# Patient Record
Sex: Male | Born: 1955 | Race: White | Hispanic: No | Marital: Married | Smoking: Former smoker
Health system: Southern US, Community
[De-identification: ages and names within clinical notes are randomized; demographics above are authoritative.]

## PROBLEM LIST (undated history)

## (undated) DIAGNOSIS — F329 Major depressive disorder, single episode, unspecified: Secondary | ICD-10-CM

## (undated) DIAGNOSIS — F32A Depression, unspecified: Secondary | ICD-10-CM

## (undated) DIAGNOSIS — K759 Inflammatory liver disease, unspecified: Secondary | ICD-10-CM

## (undated) DIAGNOSIS — H9190 Unspecified hearing loss, unspecified ear: Secondary | ICD-10-CM

## (undated) DIAGNOSIS — M1990 Unspecified osteoarthritis, unspecified site: Secondary | ICD-10-CM

## (undated) HISTORY — PX: JOINT REPLACEMENT: SHX530

## (undated) HISTORY — PX: OTHER SURGICAL HISTORY: SHX169

## (undated) HISTORY — PX: CATARACT EXTRACTION W/ INTRAOCULAR LENS IMPLANT: SHX1309

## (undated) HISTORY — PX: EYE SURGERY: SHX253

---

## 2004-02-07 HISTORY — PX: AMPUTATION FINGER: SHX6594

## 2004-07-24 ENCOUNTER — Emergency Department (HOSPITAL_COMMUNITY): Admission: EM | Admit: 2004-07-24 | Discharge: 2004-07-24 | Payer: Self-pay | Admitting: Emergency Medicine

## 2005-08-08 ENCOUNTER — Encounter: Admission: RE | Admit: 2005-08-08 | Discharge: 2005-08-08 | Payer: Self-pay | Admitting: Family Medicine

## 2008-06-15 ENCOUNTER — Ambulatory Visit: Payer: Self-pay | Admitting: Diagnostic Radiology

## 2008-06-15 ENCOUNTER — Emergency Department (HOSPITAL_BASED_OUTPATIENT_CLINIC_OR_DEPARTMENT_OTHER): Admission: EM | Admit: 2008-06-15 | Discharge: 2008-06-15 | Payer: Self-pay | Admitting: Emergency Medicine

## 2016-02-09 DIAGNOSIS — M9904 Segmental and somatic dysfunction of sacral region: Secondary | ICD-10-CM | POA: Diagnosis not present

## 2016-02-09 DIAGNOSIS — M25552 Pain in left hip: Secondary | ICD-10-CM | POA: Diagnosis not present

## 2016-02-09 DIAGNOSIS — M9903 Segmental and somatic dysfunction of lumbar region: Secondary | ICD-10-CM | POA: Diagnosis not present

## 2016-02-11 DIAGNOSIS — M9904 Segmental and somatic dysfunction of sacral region: Secondary | ICD-10-CM | POA: Diagnosis not present

## 2016-02-11 DIAGNOSIS — M25552 Pain in left hip: Secondary | ICD-10-CM | POA: Diagnosis not present

## 2016-02-11 DIAGNOSIS — M9903 Segmental and somatic dysfunction of lumbar region: Secondary | ICD-10-CM | POA: Diagnosis not present

## 2016-02-17 DIAGNOSIS — M9904 Segmental and somatic dysfunction of sacral region: Secondary | ICD-10-CM | POA: Diagnosis not present

## 2016-02-17 DIAGNOSIS — M9903 Segmental and somatic dysfunction of lumbar region: Secondary | ICD-10-CM | POA: Diagnosis not present

## 2016-02-17 DIAGNOSIS — M25552 Pain in left hip: Secondary | ICD-10-CM | POA: Diagnosis not present

## 2016-02-22 DIAGNOSIS — M9903 Segmental and somatic dysfunction of lumbar region: Secondary | ICD-10-CM | POA: Diagnosis not present

## 2016-02-22 DIAGNOSIS — M9904 Segmental and somatic dysfunction of sacral region: Secondary | ICD-10-CM | POA: Diagnosis not present

## 2016-02-22 DIAGNOSIS — M25552 Pain in left hip: Secondary | ICD-10-CM | POA: Diagnosis not present

## 2016-03-06 ENCOUNTER — Encounter (INDEPENDENT_AMBULATORY_CARE_PROVIDER_SITE_OTHER): Payer: Self-pay | Admitting: Orthopaedic Surgery

## 2016-03-06 ENCOUNTER — Ambulatory Visit (INDEPENDENT_AMBULATORY_CARE_PROVIDER_SITE_OTHER): Payer: 59 | Admitting: Orthopaedic Surgery

## 2016-03-06 ENCOUNTER — Ambulatory Visit (INDEPENDENT_AMBULATORY_CARE_PROVIDER_SITE_OTHER): Payer: Self-pay

## 2016-03-06 ENCOUNTER — Ambulatory Visit (INDEPENDENT_AMBULATORY_CARE_PROVIDER_SITE_OTHER): Payer: 59

## 2016-03-06 DIAGNOSIS — M25552 Pain in left hip: Secondary | ICD-10-CM | POA: Diagnosis not present

## 2016-03-06 DIAGNOSIS — M1612 Unilateral primary osteoarthritis, left hip: Secondary | ICD-10-CM | POA: Diagnosis not present

## 2016-03-06 MED ORDER — LIDOCAINE HCL 2 % IJ SOLN
4.0000 mL | INTRAMUSCULAR | Status: AC | PRN
  Administered 2016-03-06: 4 mL

## 2016-03-06 MED ORDER — TRIAMCINOLONE ACETONIDE 40 MG/ML IJ SUSP
80.0000 mg | INTRAMUSCULAR | Status: AC | PRN
  Administered 2016-03-06: 80 mg via INTRA_ARTICULAR

## 2016-03-06 NOTE — Progress Notes (Signed)
   Office Visit Note   Patient: Rick Flores           Date of Birth: [birth year]           MRN: [id number] Visit Date: 03/06/2016              Requested by: No referring provider defined for this encounter. PCP: No primary care provider on file.   Assessment & Plan: Visit Diagnoses:  1. Primary osteoarthritis of left hip     Plan: She has advanced discharge joint disease of left hip. Injection with Dr. Alvester MorinNewton was performed today. I'll like to see him back in about 6 weeks or so to see how he is doing. We did discuss briefly about hip replacement surgery. He does have a pamphlet.  Follow-Up Instructions: No Follow-up on file.   Orders:  Orders Placed This Encounter  Procedures  . Large Joint Injection/Arthrocentesis  . XR HIP UNILAT W OR W/O PELVIS 2-3 VIEWS LEFT  . XR C-ARM NO REPORT   No orders of the defined types were placed in this encounter.     Procedures: No procedures performed   Clinical Data: No additional findings.   Subjective: Chief Complaint  Patient presents with  . Left Hip - Pain    Patient is 61 year old gentleman very active with time months history of left hip pain worsening over the last 3 months. He is very active pain is 7-8 out of 10. Pain is localized in his left hip. This is worse with activity and running. Pain does not radiate. Denies back pain. This is affecting his activity level. He is taking ibuprofen occasionally.    Review of Systems Complete review of systems negative except for history of present illness  Objective: Vital Signs: There were no vitals taken for this visit.  Physical Exam  Constitutional: He is oriented to person, place, and time. He appears well-developed and well-nourished.  HENT:  Head: Normocephalic and atraumatic.  Eyes: Pupils are equal, round, and reactive to light.  Neck: Neck supple.  Pulmonary/Chest: Effort normal.  Abdominal: Soft.  Musculoskeletal: Normal range of motion.  Neurological: He is  alert and oriented to person, place, and time.  Skin: Skin is warm.  Psychiatric: He has a normal mood and affect. His behavior is normal. Judgment and thought content normal.  Nursing note and vitals reviewed.   Ortho Exam Exam the left hip shows negative Stinchfield sign. Mild discomfort with internal rotation of the hip. External rotation is normal. Lateral hip is nontender. Specialty Comments:  No specialty comments available.  Imaging: Xr Hip Unilat W Or W/o Pelvis 2-3 Views Left  Result Date: 03/06/2016 Advanced degenerative joint disease of left hip with moderate discharge joint disease of right hip.    PMFS History: There are no active problems to display for this patient.  No past medical history on file.  No family history on file.  No past surgical history on file. Social History   Occupational History  . Not on file.   Social History Main Topics  . Smoking status: Never Smoker  . Smokeless tobacco: Never Used  . Alcohol use Not on file  . Drug use: Unknown  . Sexual activity: Not on file

## 2016-03-06 NOTE — Progress Notes (Signed)
   Rick Flores - 61 y.o. male MRN [id number]  Date of birth: Jun 02, 1955  Office Visit Note: Visit Date: 03/06/2016 PCP: No primary care provider on file. Referred by: No ref. provider found  Subjective: Chief Complaint  Patient presents with  . Left Hip - Pain   HPI: Rick Flores is a 61 year old gentleman who saw Dr. Roda ShuttersXu today for hip pain and left-sided. He has osteoporosis of the left hip. We did complete an anesthetic arthrogram today diagnostically and therapeutically.    ROS Otherwise per HPI.  Assessment & Plan: Visit Diagnoses:  1. Pain in left hip     Plan: Findings:  Anesthetic left hip arthrogram with fluoroscopic guidance.    Meds & Orders: No orders of the defined types were placed in this encounter.   Orders Placed This Encounter  Procedures  . XR HIP UNILAT W OR W/O PELVIS 2-3 VIEWS LEFT    Follow-up: No Follow-up on file.   Procedures: Left hip anesthetic arthrogram Date/Time: 03/06/2016 11:26 AM Performed by: Tyrell AntonioNEWTON, Damein Gaunce Authorized by: Tyrell AntonioNEWTON, Micaiah Litle   Consent Given by:  Patient Site marked: the procedure site was marked   Timeout: prior to procedure the correct patient, procedure, and site was verified   Indications:  Pain and diagnostic evaluation Location:  Hip Site:  L hip joint Prep: patient was prepped and draped in usual sterile fashion   Needle Size:  22 G Approach:  Anterior Ultrasound Guidance: No   Fluoroscopic Guidance: No   Arthrogram: Yes   Medications:  4 mL lidocaine 2 %; 80 mg triamcinolone acetonide 40 MG/ML Aspiration Attempted: Yes   Patient tolerance:  Patient tolerated the procedure well with no immediate complications  Arthrogram demonstrated excellent flow of contrast throughout the joint surface without extravasation or obvious defect.  The patient had relief of symptoms during the anesthetic phase of the injection.     No notes on file   Clinical History: No specialty comments available.  He reports that he has  never smoked. He has never used smokeless tobacco. No results for input(s): HGBA1C, LABURIC in the last 8760 hours.  Objective:  VS:  HT:    WT:   BMI:     BP:   HR: bpm  TEMP: ( )  RESP:  Physical Exam  Musculoskeletal:  Pain with internal rotation of the left hip with limited range of motion.    Ortho Exam Imaging: No results found.  Past Medical/Family/Surgical/Social History: Medications & Allergies reviewed per EMR There are no active problems to display for this patient.  No past medical history on file. No family history on file. No past surgical history on file. Social History   Occupational History  . Not on file.   Social History Main Topics  . Smoking status: Never Smoker  . Smokeless tobacco: Never Used  . Alcohol use Not on file  . Drug use: Unknown  . Sexual activity: Not on file

## 2016-04-17 ENCOUNTER — Ambulatory Visit (INDEPENDENT_AMBULATORY_CARE_PROVIDER_SITE_OTHER): Payer: 59 | Admitting: Orthopaedic Surgery

## 2016-04-18 ENCOUNTER — Encounter (INDEPENDENT_AMBULATORY_CARE_PROVIDER_SITE_OTHER): Payer: Self-pay | Admitting: Orthopaedic Surgery

## 2016-04-18 ENCOUNTER — Ambulatory Visit (INDEPENDENT_AMBULATORY_CARE_PROVIDER_SITE_OTHER): Payer: 59 | Admitting: Orthopaedic Surgery

## 2016-04-18 DIAGNOSIS — M1612 Unilateral primary osteoarthritis, left hip: Secondary | ICD-10-CM | POA: Diagnosis not present

## 2016-04-18 NOTE — Progress Notes (Signed)
   Office Visit Note   Patient: Rick Flores           Date of Birth: [birth year]           MRN: [id number] Visit Date: 04/18/2016              Requested by: No referring provider defined for this encounter. PCP: Emeterio ReeveWOLTERS,SHARON A, MD   Assessment & Plan: Visit Diagnoses:  1. Primary osteoarthritis of left hip     Plan: at this point, patient is doing well.  Discussed injections 3x a year if needed.  He has my card if he needs me.  F/u prn.  Follow-Up Instructions: Return if symptoms worsen or fail to improve.   Orders:  No orders of the defined types were placed in this encounter.  No orders of the defined types were placed in this encounter.     Procedures: No procedures performed   Clinical Data: No additional findings.   Subjective: Chief Complaint  Patient presents with  . Left Hip - Pain    Patient f/u today for left hip OA s/p injection 6 weeks ago.  Doing 95% better.  Overall doing well.      Review of Systems   Objective: Vital Signs: There were no vitals taken for this visit.  Physical Exam  Ortho Exam Exam stable Specialty Comments:  No specialty comments available.  Imaging: No results found.   PMFS History: Patient Active Problem List   Diagnosis Date Noted  . Primary osteoarthritis of left hip 04/18/2016   No past medical history on file.  No family history on file.  No past surgical history on file. Social History   Occupational History  . Not on file.   Social History Main Topics  . Smoking status: Never Smoker  . Smokeless tobacco: Never Used  . Alcohol use Not on file  . Drug use: Unknown  . Sexual activity: Not on file

## 2016-06-13 DIAGNOSIS — M25552 Pain in left hip: Secondary | ICD-10-CM | POA: Diagnosis not present

## 2016-06-13 DIAGNOSIS — M1612 Unilateral primary osteoarthritis, left hip: Secondary | ICD-10-CM | POA: Diagnosis not present

## 2016-06-22 DIAGNOSIS — M25552 Pain in left hip: Secondary | ICD-10-CM | POA: Diagnosis not present

## 2016-06-22 DIAGNOSIS — M1612 Unilateral primary osteoarthritis, left hip: Secondary | ICD-10-CM | POA: Diagnosis not present

## 2016-06-23 ENCOUNTER — Other Ambulatory Visit (HOSPITAL_COMMUNITY): Payer: Self-pay | Admitting: Emergency Medicine

## 2016-06-23 NOTE — Patient Instructions (Signed)
Rick Flores  06/23/2016   Your procedure is scheduled on: 07-04-16  [facility]  Entrance Report to admitting at 735AM  Call this number if you have problems the morning of surgery [phone]   Remember: ONLY 1 PERSON MAY GO WITH YOU TO SHORT STAY TO GET  READY MORNING OF YOUR SURGERY.  Do not eat food or drink liquids :After Midnight.     Take these medicines the morning of surgery with A SIP OF WATER: aripiprazole(abilify), sertraline(zoloft)                                You may not have any metal on your body including hair pins and              piercings  Do not wear jewelry, make-up, lotions, powders or perfumes, deodorant              Men may shave face and neck.   Do not bring valuables to the hospital. [facility] IS NOT             RESPONSIBLE   FOR VALUABLES.  Contacts, dentures or bridgework may not be worn into surgery.  Leave suitcase in the car. After surgery it may be brought to your room.              Please read over the following fact sheets you were given: _____________________________________________________________________             [facility] - Preparing for [facility] Before surgery, you can play an important role.  Because skin is not sterile, your skin needs to be as free of germs as possible.  You can reduce the number of germs on your skin by washing with CHG (chlorahexidine gluconate) soap before surgery.  CHG is an antiseptic cleaner which kills germs and bonds with the skin to continue killing germs even after washing. Please DO NOT use if you have an allergy to CHG or antibacterial soaps.  If your skin becomes reddened/irritated stop using the CHG and inform your nurse when you arrive at Short Stay. Do not shave (including legs and underarms) for at least 48 hours prior to the first CHG shower.  You may shave your face/neck. Please follow these instructions carefully:  1.  Shower with CHG Soap the night  before surgery and the  morning of Surgery.  2.  If you choose to wash your hair, wash your hair first as usual with your  normal  shampoo.  3.  After you shampoo, rinse your hair and body thoroughly to remove the  shampoo.                           4.  Use CHG as you would any other liquid soap.  You can apply chg directly  to the skin and wash                       Gently with a scrungie or clean washcloth.  5.  Apply the CHG Soap to your body ONLY FROM THE NECK DOWN.   Do not use on face/ open  Wound or open sores. Avoid contact with eyes, ears mouth and genitals (private parts).                       Wash face,  Genitals (private parts) with your normal soap.             6.  Wash thoroughly, paying special attention to the area where your surgery  will be performed.  7.  Thoroughly rinse your body with warm water from the neck down.  8.  DO NOT shower/wash with your normal soap after using and rinsing off  the CHG Soap.                9.  Pat yourself dry with a clean towel.            10.  Wear clean pajamas.            11.  Place clean sheets on your bed the night of your first shower and do not  sleep with pets. Day of Surgery : Do not apply any lotions/deodorants the morning of surgery.  Please wear clean clothes to the hospital/surgery center.  FAILURE TO FOLLOW THESE INSTRUCTIONS MAY RESULT IN THE CANCELLATION OF YOUR SURGERY PATIENT SIGNATURE_________________________________  NURSE SIGNATURE__________________________________  ________________________________________________________________________   Rick Flores  An incentive spirometer is a tool that can help keep your lungs clear and active. This tool measures how well you are filling your lungs with each breath. Taking long deep breaths may help reverse or decrease the chance of developing breathing (pulmonary) problems (especially infection) following:  A long period of time when you are  unable to move or be active. BEFORE THE PROCEDURE   If the spirometer includes an indicator to show your [place] effort, your nurse or respiratory therapist will set it to a desired goal.  If possible, sit up straight or lean slightly forward. Try not to slouch.  Hold the incentive spirometer in an upright position. INSTRUCTIONS FOR USE  1. Sit on the edge of your bed if possible, or sit up as far as you can in bed or on a chair. 2. Hold the incentive spirometer in an upright position. 3. Breathe out normally. 4. Place the mouthpiece in your mouth and seal your lips tightly around it. 5. Breathe in slowly and as deeply as possible, raising the piston or the ball toward the top of the column. 6. Hold your breath for 3-5 seconds or for as long as possible. Allow the piston or ball to fall to the bottom of the column. 7. Remove the mouthpiece from your mouth and breathe out normally. 8. Rest for a few seconds and repeat Steps 1 through 7 at least 10 times every 1-2 hours when you are awake. Take your time and take a few normal breaths between deep breaths. 9. The spirometer may include an indicator to show your [place] effort. Use the indicator as a goal to work toward during each repetition. 10. After each set of 10 deep breaths, practice coughing to be sure your lungs are clear. If you have an incision (the cut made at the time of surgery), support your incision when coughing by placing a pillow or rolled up towels firmly against it. Once you are able to get out of bed, walk around indoors and cough well. You may stop using the incentive spirometer when instructed by your caregiver.  RISKS AND COMPLICATIONS  Take your time so you do not get  dizzy or light-headed.  If you are in pain, you may need to take or ask for pain medication before doing incentive spirometry. It is harder to take a deep breath if you are having pain. AFTER USE  Rest and breathe slowly and easily.  It can be helpful to  keep track of a log of your progress. Your caregiver can provide you with a simple table to help with this. If you are using the spirometer at home, follow these instructions: [place] IF:   You are having difficultly using the spirometer.  You have trouble using the spirometer as often as instructed.  Your pain medication is not giving enough relief while using the spirometer.  You develop fever of 100.5 F (38.1 C) or higher. SEEK IMMEDIATE MEDICAL CARE IF:   You cough up bloody sputum that had not been present before.  You develop fever of 102 F (38.9 C) or greater.  You develop worsening pain at or near the incision site. MAKE SURE YOU:   Understand these instructions.  Will watch your condition.  Will get help right away if you are not doing well or get worse. Document Released: 06/05/2006 Document Revised: 04/17/2011 Document Reviewed: 08/06/2006 ExitCare Patient Information 2014 ExitCare, [place].   ________________________________________________________________________  WHAT IS A BLOOD TRANSFUSION? Blood Transfusion Information  A transfusion is the replacement of blood or some of its parts. Blood is made up of multiple cells which provide different functions.  Red blood cells carry oxygen and are used for blood loss replacement.  White blood cells fight against infection.  Platelets control bleeding.  Plasma helps clot blood.  Other blood products are available for specialized needs, such as hemophilia or other clotting disorders. BEFORE THE TRANSFUSION  Who gives blood for transfusions?   Healthy volunteers who are fully evaluated to make sure their blood is safe. This is blood bank blood. Transfusion therapy is the safest it has ever been in the practice of medicine. Before blood is taken from a donor, a complete history is taken to make sure that person has no history of diseases nor engages in risky social behavior (examples are intravenous drug  use or sexual activity with multiple partners). The donor's travel history is screened to minimize risk of transmitting infections, such as malaria. The donated blood is tested for signs of infectious diseases, such as HIV and hepatitis. The blood is then tested to be sure it is compatible with you in order to minimize the chance of a transfusion reaction. If you or a relative donates blood, this is often done in anticipation of surgery and is not appropriate for emergency situations. It takes many days to process the donated blood. RISKS AND COMPLICATIONS Although transfusion therapy is very safe and saves many lives, the main dangers of transfusion include:   Getting an infectious disease.  Developing a transfusion reaction. This is an allergic reaction to something in the blood you were given. Every precaution is taken to prevent this. The decision to have a blood transfusion has been considered carefully by your caregiver before blood is given. Blood is not given unless the benefits outweigh the risks. AFTER THE TRANSFUSION  Right after receiving a blood transfusion, you will usually feel much better and more energetic. This is especially true if your red blood cells have gotten low (anemic). The transfusion raises the level of the red blood cells which carry oxygen, and this usually causes an energy increase.  The nurse administering the transfusion will  monitor you carefully for complications. HOME CARE INSTRUCTIONS  No special instructions are needed after a transfusion. You may find your energy is better. Speak with your caregiver about any limitations on activity for underlying diseases you may have. SEEK MEDICAL CARE IF:   Your condition is not improving after your transfusion.  You develop redness or irritation at the intravenous (IV) site. SEEK IMMEDIATE MEDICAL CARE IF:  Any of the following symptoms occur over the next 12 hours:  Shaking chills.  You have a temperature by mouth  above 102 F (38.9 C), not controlled by medicine.  Chest, back, or muscle pain.  People around you feel you are not acting correctly or are confused.  Shortness of breath or difficulty breathing.  Dizziness and fainting.  You get a rash or develop hives.  You have a decrease in urine output.  Your urine turns a dark color or changes to pink, red, or brown. Any of the following symptoms occur over the next 10 days:  You have a temperature by mouth above 102 F (38.9 C), not controlled by medicine.  Shortness of breath.  Weakness after normal activity.  The white part of the eye turns yellow (jaundice).  You have a decrease in the amount of urine or are urinating less often.  Your urine turns a dark color or changes to pink, red, or brown. Document Released: 01/21/2000 Document Revised: 04/17/2011 Document Reviewed: 09/09/2007 [facility] Patient Information 2014 [place], [place].  _______________________________________________________________________

## 2016-06-25 NOTE — H&P (Signed)
TOTAL HIP ADMISSION H&P  Patient is admitted for left total hip arthroplasty, anterior approach.  Subjective:  Chief Complaint:    Left hip primary OA . pain  HPI: Rick Flores, 61 y.o. male, has a history of pain and functional disability in the left hip(s) due to arthritis and patient has failed non-surgical conservative treatments for greater than 12 weeks to include NSAID's and/or analgesics, corticosteriod injections and activity modification.  Onset of symptoms was gradual starting ~15 years ago with gradually worsening course since that time.The patient noted no past surgery on the left hip(s).  Patient currently rates pain in the left hip at 7 out of 10 with activity. Patient has worsening of pain with activity and weight bearing, trendelenberg gait, pain that interfers with activities of daily living and pain with passive range of motion. Patient has evidence of periarticular osteophytes and joint space narrowing by imaging studies. This condition presents safety issues increasing the risk of falls.   There is no current active infection.   Risks, benefits and expectations were discussed with the patient.  Risks including but not limited to the risk of anesthesia, blood clots, nerve damage, blood vessel damage, failure of the prosthesis, infection and up to and including death.  Patient understand the risks, benefits and expectations and wishes to proceed with surgery.   PCP: Rick Flores, Sharon, Rick Flores  D/C Plans:       Home   Post-op Meds:       No Rx given  Tranexamic Acid:      To be given - IV   Decadron:      Is to be given  FYI:     ASA  [place]  DME:   Pt already has equipment  PT:   No PT    Patient Active Problem List   Diagnosis Date Noted  . Primary osteoarthritis of left hip 04/18/2016   Past Medical History:  Diagnosis Date  . Arthritis     Past Surgical History:  Procedure Laterality Date  . 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 colonscopy  7 years  ago    No prescriptions prior to admission.   No Known Allergies   Social History  Substance Use Topics  . Smoking status: Former Smoker    Years: 5.00    Types: Cigarettes    Quit date: [id number]  . Smokeless tobacco: Never Used  . Alcohol use 6.0 - 8.4 oz/week    5 - 7 Glasses of wine, 5 - 7 Shots of liquor per week       Review of Systems  Constitutional: Negative.   HENT: Negative.   Eyes: Negative.   Respiratory: Negative.   Cardiovascular: Negative.   Gastrointestinal: Negative.   Genitourinary: Negative.   Musculoskeletal: Positive for joint pain.  Skin: Negative.   Neurological: Negative.   Endo/Heme/Allergies: Negative.   Psychiatric/Behavioral: Negative.     Objective:  Physical Exam  Constitutional: He is oriented to person, place, and time. He appears well-developed.  HENT:  Head: Normocephalic.  Eyes: Pupils are equal, round, and reactive to light.  Neck: Neck supple. No JVD present. No tracheal deviation present. No thyromegaly present.  Cardiovascular: Normal rate, regular rhythm and intact distal pulses.   Respiratory: Effort normal and breath sounds normal. No respiratory distress. He has no wheezes.  GI: Soft. There is no tenderness. There is no guarding.  Musculoskeletal:       Left hip: He exhibits decreased range  of motion, decreased strength, tenderness and bony tenderness. He exhibits no swelling, no deformity and no laceration.  Lymphadenopathy:    He has no cervical adenopathy.  Neurological: He is alert and oriented to person, place, and time.  Skin: Skin is warm and dry.  Psychiatric: He has a normal mood and affect.      Imaging Review Plain radiographs demonstrate severe degenerative joint disease of the left hip(s). The bone quality appears to be good for age and reported activity level.  Assessment/Plan:  End stage arthritis, left hip(s)  The patient history, physical examination, clinical judgement of the provider and imaging  studies are consistent with end stage degenerative joint disease of the left hip(s) and total hip arthroplasty is deemed medically necessary. The treatment options including medical management, injection therapy, arthroscopy and arthroplasty were discussed at length. The risks and benefits of total hip arthroplasty were presented and reviewed. The risks due to aseptic loosening, infection, stiffness, dislocation/subluxation,  thromboembolic complications and other imponderables were discussed.  The patient acknowledged the explanation, agreed to proceed with the plan and consent was signed. Patient is being admitted for inpatient treatment for surgery, pain control, PT, OT, prophylactic antibiotics, VTE prophylaxis, progressive ambulation and ADL's and discharge planning.The patient is planning to be discharged home.     Rick Auerbach Jeselle Hiser   PA-C  06/27/2016, 2:51 PM

## 2016-06-27 ENCOUNTER — Encounter (HOSPITAL_COMMUNITY)
Admission: RE | Admit: 2016-06-27 | Discharge: 2016-06-27 | Disposition: A | Discharge status: Home / Self Care | Payer: 59 | Source: Ambulatory Visit | Attending: Orthopedic Surgery | Admitting: Orthopedic Surgery

## 2016-06-27 ENCOUNTER — Encounter (HOSPITAL_COMMUNITY): Payer: Self-pay

## 2016-06-27 DIAGNOSIS — Z01812 Encounter for preprocedural laboratory examination: Secondary | ICD-10-CM | POA: Insufficient documentation

## 2016-06-27 DIAGNOSIS — Z01818 Encounter for other preprocedural examination: Secondary | ICD-10-CM | POA: Diagnosis present

## 2016-06-27 DIAGNOSIS — M1612 Unilateral primary osteoarthritis, left hip: Secondary | ICD-10-CM | POA: Diagnosis not present

## 2016-06-27 HISTORY — DX: Unspecified osteoarthritis, unspecified site: M19.90

## 2016-06-27 LAB — BASIC METABOLIC PANEL
Anion gap: 9 (ref 5–15)
BUN: 18 mg/dL (ref 6–20)
CO2: 28 mmol/L (ref 22–32)
CREATININE: 0.87 mg/dL (ref 0.61–1.24)
Calcium: 9.2 mg/dL (ref 8.9–10.3)
Chloride: 100 mmol/L — ABNORMAL LOW (ref 101–111)
GFR calc Af Amer: >60 mL/min (ref >60)
Glucose, Bld: 96 mg/dL (ref 65–99)
Potassium: 4 mmol/L (ref 3.5–5.1)
SODIUM: 137 mmol/L (ref 135–145)

## 2016-06-27 LAB — ABO/RH: ABO/RH(D): O POS

## 2016-06-27 LAB — CBC
HCT: 42.9 % (ref 39.0–52.0)
Hemoglobin: 15.3 g/dL (ref 13.0–17.0)
MCH: 34.2 pg — ABNORMAL HIGH (ref 26.0–34.0)
MCHC: 35.7 g/dL (ref 30.0–36.0)
MCV: 96 fL (ref 78.0–100.0)
Platelets: 195 10*3/uL (ref 150–400)
RBC: 4.47 MIL/uL (ref 4.22–5.81)
RDW: 12 % (ref 11.5–15.5)
WBC: 5.6 10*3/uL (ref 4.0–10.5)

## 2016-06-28 LAB — SURGICAL PCR SCREEN
MRSA, PCR: POSITIVE — AB
STAPHYLOCOCCUS AUREUS: POSITIVE — AB

## 2016-06-29 DIAGNOSIS — Z01818 Encounter for other preprocedural examination: Secondary | ICD-10-CM | POA: Diagnosis not present

## 2016-06-29 DIAGNOSIS — M1990 Unspecified osteoarthritis, unspecified site: Secondary | ICD-10-CM | POA: Diagnosis not present

## 2016-06-29 DIAGNOSIS — E785 Hyperlipidemia, unspecified: Secondary | ICD-10-CM | POA: Diagnosis not present

## 2016-06-29 DIAGNOSIS — Z125 Encounter for screening for malignant neoplasm of prostate: Secondary | ICD-10-CM | POA: Diagnosis not present

## 2016-06-29 DIAGNOSIS — Z79899 Other long term (current) drug therapy: Secondary | ICD-10-CM | POA: Diagnosis not present

## 2016-07-04 ENCOUNTER — Encounter (HOSPITAL_COMMUNITY): Payer: Self-pay

## 2016-07-04 ENCOUNTER — Ambulatory Visit (HOSPITAL_COMMUNITY): Payer: 59 | Admitting: Anesthesiology

## 2016-07-04 ENCOUNTER — Encounter (HOSPITAL_COMMUNITY)
Admission: AD | Disposition: A | Discharge status: Home / Self Care | Payer: Self-pay | Source: Ambulatory Visit | Attending: Orthopedic Surgery

## 2016-07-04 ENCOUNTER — Inpatient Hospital Stay (HOSPITAL_COMMUNITY): Payer: 59

## 2016-07-04 ENCOUNTER — Inpatient Hospital Stay (HOSPITAL_COMMUNITY)
Admission: AD | Admit: 2016-07-04 | Discharge: 2016-07-05 | DRG: 470 | Disposition: A | Discharge status: Home / Self Care | Payer: 59 | Source: Ambulatory Visit | Attending: Orthopedic Surgery | Admitting: Orthopedic Surgery

## 2016-07-04 DIAGNOSIS — Z96642 Presence of left artificial hip joint: Secondary | ICD-10-CM | POA: Diagnosis not present

## 2016-07-04 DIAGNOSIS — Z471 Aftercare following joint replacement surgery: Secondary | ICD-10-CM | POA: Diagnosis not present

## 2016-07-04 DIAGNOSIS — M1612 Unilateral primary osteoarthritis, left hip: Secondary | ICD-10-CM | POA: Diagnosis not present

## 2016-07-04 DIAGNOSIS — E663 Overweight: Secondary | ICD-10-CM | POA: Diagnosis present

## 2016-07-04 DIAGNOSIS — Z79899 Other long term (current) drug therapy: Secondary | ICD-10-CM | POA: Diagnosis not present

## 2016-07-04 DIAGNOSIS — Z87891 Personal history of nicotine dependence: Secondary | ICD-10-CM | POA: Diagnosis not present

## 2016-07-04 DIAGNOSIS — Z96649 Presence of unspecified artificial hip joint: Secondary | ICD-10-CM

## 2016-07-04 DIAGNOSIS — Z7982 Long term (current) use of aspirin: Secondary | ICD-10-CM

## 2016-07-04 HISTORY — PX: TOTAL HIP ARTHROPLASTY: SHX124

## 2016-07-04 LAB — TYPE AND SCREEN
ABO/RH(D): O POS
Antibody Screen: NEGATIVE

## 2016-07-04 SURGERY — ARTHROPLASTY, HIP, TOTAL, ANTERIOR APPROACH
Anesthesia: Spinal | Site: Hip | Laterality: Left

## 2016-07-04 MED ORDER — ONDANSETRON HCL 4 MG/2ML IJ SOLN: 4.0000 mg | Freq: Four times a day (QID) | INTRAMUSCULAR | Status: DC | PRN

## 2016-07-04 MED ORDER — CELECOXIB 200 MG PO CAPS
200.0000 mg | ORAL_CAPSULE | Freq: Two times a day (BID) | ORAL | Status: DC
  Administered 2016-07-04 – 2016-07-05 (×2): 200 mg via ORAL
  Filled 2016-07-04 (×2): qty 1

## 2016-07-04 MED ORDER — DEXTROSE 5 % IV SOLN
500.0000 mg | Freq: Four times a day (QID) | INTRAVENOUS | Status: DC | PRN
  Filled 2016-07-04: qty 5

## 2016-07-04 MED ORDER — MIDAZOLAM HCL 2 MG/2ML IJ SOLN
INTRAMUSCULAR | Status: AC
  Filled 2016-07-04: qty 2

## 2016-07-04 MED ORDER — SODIUM CHLORIDE 0.9 % IR SOLN
Status: DC | PRN
  Administered 2016-07-04: 2000 mL

## 2016-07-04 MED ORDER — CEFAZOLIN SODIUM-DEXTROSE 2-4 GM/100ML-% IV SOLN
INTRAVENOUS | Status: AC
  Filled 2016-07-04: qty 100

## 2016-07-04 MED ORDER — VANCOMYCIN HCL IN DEXTROSE 1-5 GM/200ML-% IV SOLN
INTRAVENOUS | Status: AC
  Administered 2016-07-04: 1000 mg via INTRAVENOUS
  Filled 2016-07-04: qty 200

## 2016-07-04 MED ORDER — METOCLOPRAMIDE HCL 5 MG PO TABS: 5.0000 mg | ORAL_TABLET | Freq: Three times a day (TID) | ORAL | Status: DC | PRN

## 2016-07-04 MED ORDER — DOCUSATE SODIUM 100 MG PO CAPS
100.0000 mg | ORAL_CAPSULE | Freq: Two times a day (BID) | ORAL | Status: DC
  Administered 2016-07-04 – 2016-07-05 (×2): 100 mg via ORAL
  Filled 2016-07-04 (×2): qty 1

## 2016-07-04 MED ORDER — HYDROCODONE-ACETAMINOPHEN 7.5-325 MG PO TABS: 1.0000 | ORAL_TABLET | ORAL | 0 refills | Status: DC | PRN

## 2016-07-04 MED ORDER — EPHEDRINE SULFATE-NACL 50-0.9 MG/10ML-% IV SOSY
PREFILLED_SYRINGE | INTRAVENOUS | Status: DC | PRN
  Administered 2016-07-04: 5 mg via INTRAVENOUS
  Administered 2016-07-04 (×2): 10 mg via INTRAVENOUS

## 2016-07-04 MED ORDER — CEFAZOLIN SODIUM-DEXTROSE 1-4 GM/50ML-% IV SOLN
1.0000 g | Freq: Four times a day (QID) | INTRAVENOUS | Status: AC
  Administered 2016-07-04 (×2): 1 g via INTRAVENOUS
  Filled 2016-07-04 (×2): qty 50

## 2016-07-04 MED ORDER — LORATADINE 10 MG PO TABS
10.0000 mg | ORAL_TABLET | Freq: Every day | ORAL | Status: DC
  Administered 2016-07-05: 09:00:00 10 mg via ORAL
  Filled 2016-07-04: qty 1

## 2016-07-04 MED ORDER — METOCLOPRAMIDE HCL 5 MG/ML IJ SOLN: 5.0000 mg | Freq: Three times a day (TID) | INTRAMUSCULAR | Status: DC | PRN

## 2016-07-04 MED ORDER — MIDAZOLAM HCL 5 MG/5ML IJ SOLN
INTRAMUSCULAR | Status: DC | PRN
  Administered 2016-07-04: 2 mg via INTRAVENOUS

## 2016-07-04 MED ORDER — MAGNESIUM CITRATE PO SOLN: 1.0000 | Freq: Once | ORAL | Status: DC | PRN

## 2016-07-04 MED ORDER — DEXAMETHASONE SODIUM PHOSPHATE 10 MG/ML IJ SOLN
10.0000 mg | Freq: Once | INTRAMUSCULAR | Status: AC
  Administered 2016-07-04: 10 mg via INTRAVENOUS

## 2016-07-04 MED ORDER — ASPIRIN 81 MG PO CHEW: 81.0000 mg | CHEWABLE_TABLET | Freq: Two times a day (BID) | ORAL | 0 refills | Status: AC

## 2016-07-04 MED ORDER — FERROUS SULFATE 325 (65 FE) MG PO TABS
325.0000 mg | ORAL_TABLET | Freq: Three times a day (TID) | ORAL | Status: DC
  Administered 2016-07-05: 325 mg via ORAL
  Filled 2016-07-04: qty 1

## 2016-07-04 MED ORDER — FENTANYL CITRATE (PF) 100 MCG/2ML IJ SOLN
25.0000 ug | INTRAMUSCULAR | Status: DC | PRN
  Administered 2016-07-04 (×2): 50 ug via INTRAVENOUS
  Filled 2016-07-04 (×2): qty 2

## 2016-07-04 MED ORDER — PROPOFOL 10 MG/ML IV BOLUS
INTRAVENOUS | Status: AC
  Filled 2016-07-04: qty 60

## 2016-07-04 MED ORDER — POLYETHYLENE GLYCOL 3350 17 G PO PACK: 17.0000 g | PACK | Freq: Two times a day (BID) | ORAL | 0 refills | Status: DC

## 2016-07-04 MED ORDER — ASPIRIN 81 MG PO CHEW
81.0000 mg | CHEWABLE_TABLET | Freq: Two times a day (BID) | ORAL | Status: DC
  Administered 2016-07-04 – 2016-07-05 (×2): 81 mg via ORAL
  Filled 2016-07-04 (×2): qty 1

## 2016-07-04 MED ORDER — FENTANYL CITRATE (PF) 100 MCG/2ML IJ SOLN
INTRAMUSCULAR | Status: DC | PRN
  Administered 2016-07-04: 100 ug via INTRAVENOUS

## 2016-07-04 MED ORDER — FERROUS SULFATE 325 (65 FE) MG PO TABS: 325.0000 mg | ORAL_TABLET | Freq: Three times a day (TID) | ORAL | Status: DC

## 2016-07-04 MED ORDER — DIPHENHYDRAMINE HCL 25 MG PO CAPS
25.0000 mg | ORAL_CAPSULE | Freq: Four times a day (QID) | ORAL | Status: DC | PRN
  Administered 2016-07-05: 25 mg via ORAL
  Filled 2016-07-04: qty 1

## 2016-07-04 MED ORDER — SODIUM CHLORIDE 0.9 % IJ SOLN
INTRAMUSCULAR | Status: AC
  Filled 2016-07-04: qty 10

## 2016-07-04 MED ORDER — 0.9 % SODIUM CHLORIDE (POUR BTL) OPTIME
TOPICAL | Status: DC | PRN
  Administered 2016-07-04: 1000 mL

## 2016-07-04 MED ORDER — DEXAMETHASONE SODIUM PHOSPHATE 10 MG/ML IJ SOLN
INTRAMUSCULAR | Status: AC
  Filled 2016-07-04: qty 1

## 2016-07-04 MED ORDER — DEXAMETHASONE SODIUM PHOSPHATE 10 MG/ML IJ SOLN
10.0000 mg | Freq: Once | INTRAMUSCULAR | Status: AC
  Administered 2016-07-05: 09:00:00 10 mg via INTRAVENOUS
  Filled 2016-07-04: qty 1

## 2016-07-04 MED ORDER — SPIRITUS FRUMENTI
1.0000 | Freq: Once | ORAL | Status: DC
  Filled 2016-07-04: qty 2

## 2016-07-04 MED ORDER — LACTATED RINGERS IV SOLN
INTRAVENOUS | Status: DC
  Administered 2016-07-04 (×2): via INTRAVENOUS

## 2016-07-04 MED ORDER — EPHEDRINE 5 MG/ML INJ
INTRAVENOUS | Status: AC
  Filled 2016-07-04: qty 10

## 2016-07-04 MED ORDER — ONDANSETRON HCL 4 MG/2ML IJ SOLN
INTRAMUSCULAR | Status: AC
  Filled 2016-07-04: qty 2

## 2016-07-04 MED ORDER — HYDROCODONE-ACETAMINOPHEN 7.5-325 MG PO TABS
1.0000 | ORAL_TABLET | ORAL | Status: DC
  Administered 2016-07-04 – 2016-07-05 (×6): 2 via ORAL
  Filled 2016-07-04 (×6): qty 2

## 2016-07-04 MED ORDER — HYDROMORPHONE HCL 1 MG/ML IJ SOLN: 0.2500 mg | INTRAMUSCULAR | Status: DC | PRN

## 2016-07-04 MED ORDER — CEFAZOLIN SODIUM-DEXTROSE 2-4 GM/100ML-% IV SOLN
2.0000 g | INTRAVENOUS | Status: AC
  Administered 2016-07-04: 2 g via INTRAVENOUS

## 2016-07-04 MED ORDER — ONDANSETRON HCL 4 MG PO TABS: 4.0000 mg | ORAL_TABLET | Freq: Four times a day (QID) | ORAL | Status: DC | PRN

## 2016-07-04 MED ORDER — SODIUM CHLORIDE 0.9 % IV SOLN
INTRAVENOUS | Status: DC
  Administered 2016-07-04: 23:00:00 via INTRAVENOUS
  Administered 2016-07-04: 1000 mL via INTRAVENOUS

## 2016-07-04 MED ORDER — ARIPIPRAZOLE 2 MG PO TABS
2.0000 mg | ORAL_TABLET | Freq: Every day | ORAL | Status: DC
  Administered 2016-07-05: 09:00:00 2 mg via ORAL
  Filled 2016-07-04: qty 1

## 2016-07-04 MED ORDER — ONDANSETRON HCL 4 MG/2ML IJ SOLN: 4.0000 mg | Freq: Once | INTRAMUSCULAR | Status: DC | PRN

## 2016-07-04 MED ORDER — ALUM & MAG HYDROXIDE-SIMETH 200-200-20 MG/5ML PO SUSP
15.0000 mL | ORAL | Status: DC | PRN
  Administered 2016-07-05: 10:00:00 15 mL via ORAL
  Filled 2016-07-04: qty 30

## 2016-07-04 MED ORDER — PROPOFOL 10 MG/ML IV BOLUS
INTRAVENOUS | Status: DC | PRN
  Administered 2016-07-04: 20 mg via INTRAVENOUS

## 2016-07-04 MED ORDER — METHOCARBAMOL 500 MG PO TABS: 500.0000 mg | ORAL_TABLET | Freq: Four times a day (QID) | ORAL | 0 refills | Status: DC | PRN

## 2016-07-04 MED ORDER — SERTRALINE HCL 50 MG PO TABS
150.0000 mg | ORAL_TABLET | Freq: Every day | ORAL | Status: DC
  Administered 2016-07-05: 09:00:00 150 mg via ORAL
  Filled 2016-07-04: qty 3

## 2016-07-04 MED ORDER — POLYETHYLENE GLYCOL 3350 17 G PO PACK
17.0000 g | PACK | Freq: Two times a day (BID) | ORAL | Status: DC
  Administered 2016-07-04: 21:00:00 17 g via ORAL
  Filled 2016-07-04: qty 1

## 2016-07-04 MED ORDER — MENTHOL 3 MG MT LOZG: 1.0000 | LOZENGE | OROMUCOSAL | Status: DC | PRN

## 2016-07-04 MED ORDER — TRANEXAMIC ACID 1000 MG/10ML IV SOLN
1000.0000 mg | INTRAVENOUS | Status: AC
  Administered 2016-07-04: 1000 mg via INTRAVENOUS
  Filled 2016-07-04: qty 1100
  Filled 2016-07-04: qty 10

## 2016-07-04 MED ORDER — DOCUSATE SODIUM 100 MG PO CAPS: 100.0000 mg | ORAL_CAPSULE | Freq: Two times a day (BID) | ORAL | 0 refills | Status: DC

## 2016-07-04 MED ORDER — MEPERIDINE HCL 50 MG/ML IJ SOLN: 6.2500 mg | INTRAMUSCULAR | Status: DC | PRN

## 2016-07-04 MED ORDER — VANCOMYCIN HCL IN DEXTROSE 1-5 GM/200ML-% IV SOLN
1000.0000 mg | Freq: Once | INTRAVENOUS | Status: AC
  Administered 2016-07-04: 1000 mg via INTRAVENOUS

## 2016-07-04 MED ORDER — PHENOL 1.4 % MT LIQD
1.0000 | OROMUCOSAL | Status: DC | PRN
  Filled 2016-07-04: qty 177

## 2016-07-04 MED ORDER — LORATADINE-PSEUDOEPHEDRINE ER 10-240 MG PO TB24: 1.0000 | ORAL_TABLET | Freq: Every evening | ORAL | Status: DC

## 2016-07-04 MED ORDER — BUPIVACAINE IN DEXTROSE 0.75-8.25 % IT SOLN
INTRATHECAL | Status: DC | PRN
  Administered 2016-07-04: 2 mL via INTRATHECAL

## 2016-07-04 MED ORDER — PROPOFOL 500 MG/50ML IV EMUL
INTRAVENOUS | Status: DC | PRN
  Administered 2016-07-04: 100 ug/kg/min via INTRAVENOUS

## 2016-07-04 MED ORDER — FENTANYL CITRATE (PF) 100 MCG/2ML IJ SOLN
INTRAMUSCULAR | Status: AC
  Filled 2016-07-04: qty 2

## 2016-07-04 MED ORDER — PSEUDOEPHEDRINE HCL ER 120 MG PO TB12
120.0000 mg | ORAL_TABLET | Freq: Two times a day (BID) | ORAL | Status: DC
  Administered 2016-07-04: 120 mg via ORAL
  Filled 2016-07-04 (×2): qty 1

## 2016-07-04 MED ORDER — METHOCARBAMOL 500 MG PO TABS
500.0000 mg | ORAL_TABLET | Freq: Four times a day (QID) | ORAL | Status: DC | PRN
  Administered 2016-07-04 – 2016-07-05 (×4): 500 mg via ORAL
  Filled 2016-07-04 (×3): qty 1

## 2016-07-04 MED ORDER — BISACODYL 10 MG RE SUPP: 10.0000 mg | Freq: Every day | RECTAL | Status: DC | PRN

## 2016-07-04 MED ORDER — CHLORHEXIDINE GLUCONATE 4 % EX LIQD: 60.0000 mL | Freq: Once | CUTANEOUS | Status: DC

## 2016-07-04 MED ORDER — ONDANSETRON HCL 4 MG/2ML IJ SOLN
INTRAMUSCULAR | Status: DC | PRN
  Administered 2016-07-04: 4 mg via INTRAVENOUS

## 2016-07-04 SURGICAL SUPPLY — 38 items
ADH SKN CLS APL DERMABOND .7 (GAUZE/BANDAGES/DRESSINGS) ×1
BAG SPEC THK2 15X12 ZIP CLS (MISCELLANEOUS) ×1
BAG ZIPLOCK 12X15 (MISCELLANEOUS) ×3 IMPLANT
BLADE SAG 18X100X1.27 (BLADE) ×3 IMPLANT
CAPT HIP TOTAL 2 ×2 IMPLANT
CLOTH BEACON ORANGE TIMEOUT ST (SAFETY) ×3 IMPLANT
COVER PERINEAL POST (MISCELLANEOUS) ×3 IMPLANT
COVER SURGICAL LIGHT HANDLE (MISCELLANEOUS) ×3 IMPLANT
DERMABOND ADVANCED (GAUZE/BANDAGES/DRESSINGS) ×2
DERMABOND ADVANCED .7 DNX12 (GAUZE/BANDAGES/DRESSINGS) ×1 IMPLANT
DRAPE STERI IOBAN 125X83 (DRAPES) ×3 IMPLANT
DRAPE U-SHAPE 47X51 STRL (DRAPES) ×6 IMPLANT
DRESSING AQUACEL AG SP 3.5X10 (GAUZE/BANDAGES/DRESSINGS) ×1 IMPLANT
DRSG AQUACEL AG SP 3.5X10 (GAUZE/BANDAGES/DRESSINGS) ×3
DURAPREP 26ML APPLICATOR (WOUND CARE) ×3 IMPLANT
ELECT REM PT RETURN 15FT ADLT (MISCELLANEOUS) ×3 IMPLANT
GLOVE BIOGEL M STRL SZ7.5 (GLOVE) IMPLANT
GLOVE BIOGEL PI IND STRL 7.5 (GLOVE) ×5 IMPLANT
GLOVE BIOGEL PI IND STRL 8.5 (GLOVE) ×1 IMPLANT
GLOVE BIOGEL PI INDICATOR 7.5 (GLOVE) ×10
GLOVE BIOGEL PI INDICATOR 8.5 (GLOVE) ×2
GLOVE ECLIPSE 8.0 STRL XLNG CF (GLOVE) ×6 IMPLANT
GLOVE ORTHO TXT STRL SZ7.5 (GLOVE) ×3 IMPLANT
GLOVE SURG SS PI 7.0 STRL IVOR (GLOVE) ×2 IMPLANT
GLOVE SURG SS PI 7.5 STRL IVOR (GLOVE) ×2 IMPLANT
GOWN SPEC L3 XXLG W/TWL (GOWN DISPOSABLE) ×2 IMPLANT
GOWN STRL REUS W/TWL LRG LVL3 (GOWN DISPOSABLE) ×3 IMPLANT
GOWN STRL REUS W/TWL XL LVL3 (GOWN DISPOSABLE) ×5 IMPLANT
HOLDER FOLEY CATH W/STRAP (MISCELLANEOUS) ×3 IMPLANT
PACK ANTERIOR HIP CUSTOM (KITS) ×3 IMPLANT
SUT MNCRL AB 4-0 PS2 18 (SUTURE) ×3 IMPLANT
SUT STRATAFIX 0 PDS 27 VIOLET (SUTURE) ×3
SUT VIC AB 1 CT1 36 (SUTURE) ×9 IMPLANT
SUT VIC AB 2-0 CT1 27 (SUTURE) ×6
SUT VIC AB 2-0 CT1 TAPERPNT 27 (SUTURE) ×2 IMPLANT
SUTURE STRATFX 0 PDS 27 VIOLET (SUTURE) ×1 IMPLANT
TRAY FOLEY W/METER SILVER 16FR (SET/KITS/TRAYS/PACK) ×2 IMPLANT
YANKAUER SUCT BULB TIP 10FT TU (MISCELLANEOUS) ×2 IMPLANT

## 2016-07-04 NOTE — Interval H&P Note (Signed)
History and Physical Interval Note:  07/04/2016 9:03 AM  Rick Flores  has presented today for surgery, with the diagnosis of Left hip osteoarthritis  The various methods of treatment have been discussed with the patient and family. After consideration of risks, benefits and other options for treatment, the patient has consented to  Procedure(s): LEFT TOTAL HIP ARTHROPLASTY ANTERIOR APPROACH (Left) as a surgical intervention .  The patient's history has been reviewed, patient examined, no change in status, stable for surgery.  I have reviewed the patient's chart and labs.  Questions were answered to the patient's satisfaction.     Shelda PalLIN,Cassandria Drew D

## 2016-07-04 NOTE — Transfer of Care (Signed)
Immediate Anesthesia Transfer of Care Note  Patient: Rick Flores  Procedure(s) Performed: Procedure(s): LEFT TOTAL HIP ARTHROPLASTY ANTERIOR APPROACH (Left)  Patient Location: PACU  Anesthesia Type:Spinal  Level of Consciousness: sedated  Airway & Oxygen Therapy: Patient Spontanous Breathing and Patient connected to face mask oxygen  Post-op Assessment: Report given to RN and Post -op Vital signs reviewed and stable  Post vital signs: Reviewed and stable  Last Vitals:  Vitals:   07/04/16 0751  BP: 129/79  Pulse: 81  Resp: 18  Temp: 36.8 C    Last Pain:  Vitals:   07/04/16 0808  TempSrc:   PainSc: 7          Complications: No apparent anesthesia complications

## 2016-07-04 NOTE — Anesthesia Postprocedure Evaluation (Signed)
Anesthesia Post Note  Patient: Rick ModyJoseph Flores  Procedure(s) Performed: Procedure(s) (LRB): LEFT TOTAL HIP ARTHROPLASTY ANTERIOR APPROACH (Left)  Patient location during evaluation: PACU Anesthesia Type: Spinal Level of consciousness: oriented and awake and alert Pain management: pain level controlled Vital Signs Assessment: post-procedure vital signs reviewed and stable Respiratory status: spontaneous breathing, respiratory function stable and nonlabored ventilation Cardiovascular status: blood pressure returned to baseline and stable Postop Assessment: no headache, no backache, spinal receding, no signs of nausea or vomiting and patient able to bend at knees Anesthetic complications: no       Last Vitals:  Vitals:   07/04/16 1226 07/04/16 1239  BP: 116/70 134/71  Pulse: 81 72  Resp: 16 16  Temp: 36.3 C 36.5 C    Last Pain:  Vitals:   07/04/16 1310  TempSrc:   PainSc: 8                  Davona Kinoshita A.

## 2016-07-04 NOTE — Anesthesia Preprocedure Evaluation (Signed)
Anesthesia Evaluation  Patient identified by MRN, date of birth, ID band Patient awake    Reviewed: Allergy & Precautions, NPO status , Patient's Chart, lab work & pertinent test results  Airway Mallampati: II  TM Distance: >3 FB Neck ROM: Full    Dental no notable dental hx. (+) Teeth Intact   Pulmonary former smoker,    Pulmonary exam normal breath sounds clear to auscultation       Cardiovascular negative cardio ROS Normal cardiovascular exam Rhythm:Regular Rate:Normal     Neuro/Psych Depression negative neurological ROS     GI/Hepatic negative GI ROS, Neg liver ROS,   Endo/Other  negative endocrine ROS  Renal/GU negative Renal ROS  negative genitourinary   Musculoskeletal  (+) Arthritis , Osteoarthritis,  OA left hip   Abdominal   Peds  Hematology negative hematology ROS (+)   Anesthesia Other Findings   Reproductive/Obstetrics                             Anesthesia Physical Anesthesia Plan  ASA: II  Anesthesia Plan: Spinal   Post-op Pain Management:    Induction:   Airway Management Planned: Natural Airway and Simple Face Mask  Additional Equipment:   Intra-op Plan:   Post-operative Plan:   Informed Consent: I have reviewed the patients History and Physical, chart, labs and discussed the procedure including the risks, benefits and alternatives for the proposed anesthesia with the patient or authorized representative who has indicated his/her understanding and acceptance.   Dental advisory given  Plan Discussed with: CRNA, Anesthesiologist and Surgeon  Anesthesia Plan Comments:         Anesthesia Quick Evaluation

## 2016-07-04 NOTE — Anesthesia Procedure Notes (Signed)
Spinal  Patient location during procedure: OR Start time: 07/04/2016 9:37 AM End time: 07/04/2016 10:03 AM Staffing Resident/CRNA: Danley Danker L Performed: resident/CRNA  Preanesthetic Checklist Completed: patient identified, site marked, surgical consent, pre-op evaluation, timeout performed, IV checked, risks and benefits discussed and monitors and equipment checked Spinal Block Patient position: sitting Prep: Betadine Patient monitoring: heart rate, continuous pulse ox and blood pressure Location: L3-4 Injection technique: single-shot Needle Needle type: Sprotte  Needle gauge: 24 G Needle length: 9 cm Additional Notes Kit expiration date 12/07/2018 and lot # [id number] Clear CSF free flow, negative heme, negative paresthesia Tolerated well and returned to supine position

## 2016-07-04 NOTE — Discharge Instructions (Signed)

## 2016-07-04 NOTE — Anesthesia Procedure Notes (Deleted)
Spinal

## 2016-07-04 NOTE — Op Note (Signed)
NAME:  Rick Flores                ACCOUNT NO.: [id number]      MEDICAL RECORD NO.: [id number]      FACILITY:  [facility]      PHYSICIAN:  Durene RomansLIN,Lavern Maslow D  DATE OF BIRTH:  [birth year]     DATE OF PROCEDURE:  07/04/2016                                 OPERATIVE REPORT         PREOPERATIVE DIAGNOSIS: Left  hip osteoarthritis.      POSTOPERATIVE DIAGNOSIS:  Left hip osteoarthritis.      PROCEDURE:  Left total hip replacement through an anterior approach   utilizing DePuy THR system, component size 52mm pinnacle cup, a size 36+4 neutral   Altrex liner, a size 3 Hi Tri Lock stem with a 36+5 delta ceramic   ball.      SURGEON:  Madlyn FrankelMatthew D. Charlann Boxerlin, M.D.      ASSISTANT:  Lanney GinsMatthew Babish, PA-C     ANESTHESIA:  Spinal.      SPECIMENS:  None.      COMPLICATIONS:  None.      BLOOD LOSS:  400 cc     DRAINS:  None.      INDICATION OF THE PROCEDURE:  Rick Flores is a 61 y.o. male who had   presented to office for evaluation of left hip pain.  Radiographs revealed   progressive degenerative changes with bone-on-bone   articulation to the  hip joint.  The patient had painful limited range of   motion significantly affecting their overall quality of life.  The patient was failing to    respond to conservative measures, and at this point was ready   to proceed with more definitive measures.  The patient has noted progressive   degenerative changes in his hip, progressive problems and dysfunction   with regarding the hip prior to surgery.  Consent was obtained for   benefit of pain relief.  Specific risk of infection, DVT, component   failure, dislocation, need for revision surgery, as well discussion of   the anterior versus posterior approach were reviewed.  Consent was   obtained for benefit of anterior pain relief through an anterior   approach.      PROCEDURE IN DETAIL:  The patient was brought to operative theater.   Once adequate anesthesia, preoperative  antibiotics, 2 gm of Ancef, and 1 gm of Tranexamic Acid, and 10 mg of Decadron administered.   The patient was positioned supine on the OSI Hanna table.  Once adequate   padding of boney process was carried out, we had predraped out the hip, and  used fluoroscopy to confirm orientation of the pelvis and position.      The left hip was then prepped and draped from proximal iliac crest to   mid thigh with shower curtain technique.      Time-out was performed identifying the patient, planned procedure, and   extremity.     An incision was then made 2 cm distal and lateral to the   anterior superior iliac spine extending over the orientation of the   tensor fascia lata muscle and sharp dissection was carried down to the   fascia of the muscle and protractor placed in the soft tissues.      The fascia was  then incised.  The muscle belly was identified and swept   laterally and retractor placed along the superior neck.  Following   cauterization of the circumflex vessels and removing some pericapsular   fat, a second cobra retractor was placed on the inferior neck.  A third   retractor was placed on the anterior acetabulum after elevating the   anterior rectus.  A L-capsulotomy was along the line of the   superior neck to the trochanteric fossa, then extended proximally and   distally.  Tag sutures were placed and the retractors were then placed   intracapsular.  We then identified the trochanteric fossa and   orientation of my neck cut, confirmed this radiographically   and then made a neck osteotomy with the femur on traction.  The femoral   head was removed without difficulty or complication.  Traction was let   off and retractors were placed posterior and anterior around the   acetabulum.      The labrum and foveal tissue were debrided.  I began reaming with a 46mm   reamer and reamed up to 51mm reamer with good bony bed preparation and a 52mm   cup was chosen.  The final 52mm Pinnacle  cup was then impacted under fluoroscopy  to confirm the depth of penetration and orientation with respect to   abduction.  A screw was placed followed by the hole eliminator.  The final   36+4 neutral Altrex liner was impacted with good visualized rim fit.  The cup was positioned anatomically within the acetabular portion of the pelvis.      At this point, the femur was rolled at 80 degrees.  Further capsule was   released off the inferior aspect of the femoral neck.  I then   released the superior capsule proximally.  The hook was placed laterally   along the femur and elevated manually and held in position with the bed   hook.  The leg was then extended and adducted with the leg rolled to 100   degrees of external rotation.  Once the proximal femur was fully   exposed, I used a box osteotome to set orientation.  I then began   broaching with the starting chili pepper broach and passed this by hand and then broached up to 3.  With the 3 broach in place I chose a high offset neck and did several trial reductions.  The offset was appropriate, leg lengths   appeared to be equal Woo matched with the +5 head ball confirmed radiographically.   Given these findings, I went ahead and dislocated the hip, repositioned all   retractors and positioned the right hip in the extended and abducted position.  The final 3 Hi Tri Lock stem was   chosen and it was impacted down to the level of neck cut.  Based on this   and the trial reduction, a 36+5 delta ceramic ball was chosen and   impacted onto a clean and dry trunnion, and the hip was reduced.  The   hip had been irrigated throughout the case again at this point.  I did   reapproximate the superior capsular leaflet to the anterior leaflet   using #1 Vicryl.  The fascia of the   tensor fascia lata muscle was then reapproximated using #1 Vicryl and #0 Stratafix sutures.  The   remaining wound was closed with 2-0 Vicryl and running 4-0 Monocryl.   The hip  was cleaned, dried, and  dressed sterilely using Dermabond and   Aquacel dressing.  He was then brought   to recovery room in stable condition tolerating the procedure well.    Lanney Gins, PA-C was present for the entirety of the case involved from   preoperative positioning, perioperative retractor management, general   facilitation of the case, as well as primary wound closure as assistant.            Madlyn Frankel Charlann Boxer, M.D.        07/04/2016 11:19 AM

## 2016-07-04 NOTE — Evaluation (Signed)
Physical Therapy Evaluation Patient Details Name: Rick Flores MRN: [id number] DOB: [birth year] Today's Date: 07/04/2016   History of Present Illness  s/p L THA  Clinical Impression  Pt is s/p THA resulting in the deficits listed below (see PT Problem List). * Pt will benefit from skilled PT to increase their independence and safety with mobility to allow discharge to the venue listed below.      Follow Up Recommendations No PT follow up;DC plan and follow up therapy as arranged by surgeon    Equipment Recommendations  Rolling walker with 5" wheels    Recommendations for Other Services       Precautions / Restrictions Restrictions Weight Bearing Restrictions: No Other Position/Activity Restrictions: WBAT      Mobility  Bed Mobility Overal bed mobility: Needs Assistance Bed Mobility: Supine to Sit     Supine to sit: Min assist     General bed mobility comments: cues for self assisting  Transfers Overall transfer level: Needs assistance Equipment used: Rolling walker (2 wheeled) Transfers: Sit to/from Stand Sit to Stand: Min guard;Supervision         General transfer comment: cues for safety and hand placement  Ambulation/Gait Ambulation/Gait assistance: Min guard Ambulation Distance (Feet): 60 Feet Assistive device: Rolling walker (2 wheeled) Gait Pattern/deviations: Step-to pattern;Step-through pattern     General Gait Details: cues for sequence, RW position   Stairs            Wheelchair Mobility    Modified Rankin (Stroke Patients Only)       Balance                                             Pertinent Vitals/Pain Pain Assessment: 0-10 Pain Score: 6  Pain Location: left hip Pain Descriptors / Indicators: Sore;Burning;Throbbing Pain Intervention(s): Limited activity within patient's tolerance;Monitored during session;Premedicated before session;Repositioned;Ice applied    Home Living Family/patient expects to be  discharged to:: Private residence Living Arrangements: Spouse/significant other   Type of Home: House Home Access: Stairs to enter   Secretary/administrator of Steps: 4 Home Layout: One level Home Equipment: None      Prior Function Level of Independence: Independent         Comments: used "walking stick" sometimes     Hand Dominance        Extremity/Trunk Assessment   Upper Extremity Assessment Upper Extremity Assessment: Overall WFL for tasks assessed    Lower Extremity Assessment Lower Extremity Assessment: LLE deficits/detail LLE Deficits / Details: ankle WFL, knee and hip 2+/5 grossly, limited by post op pain       Communication   Communication: No difficulties  Cognition Arousal/Alertness: Awake/alert Behavior During Therapy: WFL for tasks assessed/performed Overall Cognitive Status: Within Functional Limits for tasks assessed                                        General Comments      Exercises Total Joint Exercises Ankle Circles/Pumps: AROM;Both;10 reps Quad Sets: Both;AROM;5 reps   Assessment/Plan    PT Assessment Patient needs continued PT services  PT Problem List Decreased strength;Decreased activity tolerance;Decreased mobility;Decreased knowledge of use of DME;Pain       PT Treatment Interventions Functional mobility training;Stair training;Gait training;DME instruction;Therapeutic activities;Therapeutic exercise;Patient/family education  PT Goals (Current goals can be found in the Care Plan section)  Acute Rehab PT Goals Patient Stated Goal: return to independence PT Goal Formulation: With patient Time For Goal Achievement: 07/06/16 Potential to Achieve Goals: Good    Frequency 7X/week   Barriers to discharge        Co-evaluation               AM-PAC PT "6 Clicks" Daily Activity  Outcome Measure Difficulty turning over in bed (including adjusting bedclothes, sheets and blankets)?: A Little Difficulty  moving from lying on back to sitting on the side of the bed? : A Little Difficulty sitting down on and standing up from a chair with arms (e.g., wheelchair, bedside commode, etc,.)?: A Little Help needed moving to and from a bed to chair (including a wheelchair)?: A Little Help needed walking in hospital room?: A Little Help needed climbing 3-5 steps with a railing? : A Little 6 Click Score: 18    End of Session Equipment Utilized During Treatment: Gait belt Activity Tolerance: Patient tolerated treatment well Patient left: in chair;with call bell/phone within reach Nurse Communication: Mobility status PT Visit Diagnosis: Difficulty in walking, not elsewhere classified (R26.2);Pain Pain - Right/Left: Left Pain - part of body: Hip    Time: [id number] PT Time Calculation (min) (ACUTE ONLY): 16 min   Charges:   PT Evaluation $PT Eval Low Complexity: 1 Procedure     PT G CodesDrucilla Chalet:        Ileene Allie, PT Pager: [phone] 07/04/2016   [facility],Arsema Tusing 07/04/2016, 3:30 PM

## 2016-07-05 ENCOUNTER — Encounter (HOSPITAL_COMMUNITY): Payer: Self-pay | Admitting: Orthopedic Surgery

## 2016-07-05 DIAGNOSIS — E663 Overweight: Secondary | ICD-10-CM | POA: Diagnosis present

## 2016-07-05 LAB — BASIC METABOLIC PANEL
Anion gap: 4 — ABNORMAL LOW (ref 5–15)
BUN: 10 mg/dL (ref 6–20)
CALCIUM: 8.6 mg/dL — AB (ref 8.9–10.3)
CO2: 32 mmol/L (ref 22–32)
CREATININE: 0.77 mg/dL (ref 0.61–1.24)
Chloride: 103 mmol/L (ref 101–111)
GFR calc non Af Amer: >60 mL/min (ref >60)
Glucose, Bld: 149 mg/dL — ABNORMAL HIGH (ref 65–99)
Potassium: 4.5 mmol/L (ref 3.5–5.1)
SODIUM: 139 mmol/L (ref 135–145)

## 2016-07-05 LAB — CBC
HEMATOCRIT: 38.6 % — AB (ref 39.0–52.0)
Hemoglobin: 13.5 g/dL (ref 13.0–17.0)
MCH: 33.7 pg (ref 26.0–34.0)
MCHC: 35 g/dL (ref 30.0–36.0)
MCV: 96.3 fL (ref 78.0–100.0)
Platelets: 183 10*3/uL (ref 150–400)
RBC: 4.01 MIL/uL — ABNORMAL LOW (ref 4.22–5.81)
RDW: 12 % (ref 11.5–15.5)
WBC: 12.1 10*3/uL — ABNORMAL HIGH (ref 4.0–10.5)

## 2016-07-05 NOTE — Evaluation (Signed)
Occupational Therapy Evaluation Patient Details Name: Rick Flores MRN: [id number] DOB: May 09, 1955 Today's Date: 07/05/2016    History of Present Illness s/p L THA   Clinical Impression   This 61 year old man was admitted for the above sx. All education was completed. No further OT is needed at this time    Follow Up Recommendations  No OT follow up    Equipment Recommendations  None recommended by OT    Recommendations for Other Services       Precautions / Restrictions Precautions Precautions: Fall Restrictions Weight Bearing Restrictions: No      Mobility Bed Mobility         Supine to sit: Modified independent (Device/Increase time)        Transfers   Equipment used: Rolling walker (2 wheeled)   Sit to Stand: Supervision         General transfer comment: pt self cued    Balance                                           ADL either performed or assessed with clinical judgement   ADL Overall ADL's : Needs assistance/impaired                         Toilet Transfer: Supervision/safety;Ambulation;Comfort height toilet   Toileting- Clothing Manipulation and Hygiene: Supervision/safety;Sit to/from stand   Tub/ Shower Transfer: Walk-in shower;Min guard;Ambulation     General ADL Comments: pt can perform adls with set up/supervision, sit to stand. Educated on Personal assistantwalker safety.  Pt has built in shower seat.  Practiced comfort height commode.  Pt has a standard, but he is very strong; should be able to get up from standard commode with vanity next to it     Vision         Perception     Praxis      Pertinent Vitals/Pain Pain Assessment: Faces Pain Score: 2  Faces Pain Scale: Hurts a little bit Pain Location: left hip Pain Descriptors / Indicators: Sore Pain Intervention(s): Limited activity within patient's tolerance;Monitored during session;Repositioned;Premedicated before session;Ice applied     Hand Dominance      Extremity/Trunk Assessment Upper Extremity Assessment Upper Extremity Assessment: Overall WFL for tasks assessed           Communication Communication Communication: No difficulties   Cognition Arousal/Alertness: Awake/alert Behavior During Therapy: WFL for tasks assessed/performed Overall Cognitive Status: Within Functional Limits for tasks assessed                                     General Comments       Exercises     Shoulder Instructions      Home Living Family/patient expects to be discharged to:: Private residence Living Arrangements: Spouse/significant other Available Help at Discharge: Family               Bathroom Shower/Tub: Walk-in Human resources officershower   Bathroom Toilet: Standard     Home Equipment: Shower seat - built in          Prior Functioning/Environment Level of Independence: Independent                 OT Problem List:        OT Treatment/Interventions:  OT Goals(Current goals can be found in the care plan section) Acute Rehab OT Goals Patient Stated Goal: return to independence OT Goal Formulation: All assessment and education complete, DC therapy  OT Frequency:     Barriers to D/C:            Co-evaluation              AM-PAC PT "6 Clicks" Daily Activity     Outcome Measure Help from another person eating meals?: None Help from another person taking care of personal grooming?: A Little Help from another person toileting, which includes using toliet, bedpan, or urinal?: A Little Help from another person bathing (including washing, rinsing, drying)?: A Little Help from another person to put on and taking off regular upper body clothing?: A Little Help from another person to put on and taking off regular lower body clothing?: A Little 6 Click Score: 19   End of Session    Activity Tolerance: Patient tolerated treatment well Patient left: in chair;with call bell/phone within reach  OT Visit  Diagnosis: Pain Pain - Right/Left: Left Pain - part of body: Hip                Time: 1610-9604 OT Time Calculation (min): 19 min Charges:  OT General Charges $OT Visit: 1 Procedure OT Evaluation $OT Eval Low Complexity: 1 Procedure G-Codes:     Rick Flores, OTR/L [phone] 07/05/2016  Rick Flores 07/05/2016, 9:30 AM

## 2016-07-05 NOTE — Progress Notes (Signed)
Discharge planning, no HH needs identified. Has DME. [phone] 

## 2016-07-05 NOTE — Progress Notes (Signed)
     Subjective: 1 Day Post-Op Procedure(s) (LRB): LEFT TOTAL HIP ARTHROPLASTY ANTERIOR APPROACH (Left)   Patient reports pain as mild, pain controlled. No events throughout the night. Patient states that the hip feels good and that he is ready to recover and get better.  Ready to be discharged home.  Objective:   VITALS:   Vitals:   07/05/16 0535 07/05/16 0923  BP: 117/71 131/71  Pulse: 89 85  Resp: 16 16  Temp: 97.7 F (36.5 C) 97.8 F (36.6 C)    Dorsiflexion/Plantar flexion intact Incision: dressing C/D/I No cellulitis present Compartment soft  LABS  Recent Labs  07/05/16 0435  HGB 13.5  HCT 38.6*  WBC 12.1*  PLT 183     Recent Labs  07/05/16 0435  NA 139  K 4.5  BUN 10  CREATININE 0.77  GLUCOSE 149*     Assessment/Plan: 1 Day Post-Op Procedure(s) (LRB): LEFT TOTAL HIP ARTHROPLASTY ANTERIOR APPROACH (Left) Foley cath d/c'ed Advance diet Up with therapy D/C IV fluids Discharge home [facility]. Follow up with OLIN,Timon Geissinger D in 2 weeks.  Contact information:  [facility] [address], [address] [place] [id number] [phone]    Overweight (BMI 25-29.9) Estimated body mass index is 26.15 kg/m as calculated from the following:   Height as of this encounter: 5\' 8"  (1.727 m).   Weight as of this encounter: 78 kg (172 lb). Patient also counseled that weight may inhibit the healing process Patient counseled that losing weight will help with future health issues       Anastasio AuerbachMatthew S. Cameron Schwinn   PAC  07/05/2016, 9:24 AM

## 2016-07-05 NOTE — Progress Notes (Signed)
Physical Therapy Treatment Patient Details Name: Rick Flores MRN: [id number] DOB: February 03, 1956 Today's Date: 07/05/2016    History of Present Illness s/p L THA    PT Comments    Pt making excellent progress; pt and wife feel ready for D/C   Follow Up Recommendations  No PT follow up;DC plan and follow up therapy as arranged by surgeon     Equipment Recommendations  Rolling walker with 5" wheels    Recommendations for Other Services       Precautions / Restrictions Precautions Precautions: Fall Restrictions Weight Bearing Restrictions: No Other Position/Activity Restrictions: WBAT    Mobility  Bed Mobility         Supine to sit: Modified independent (Device/Increase time)     General bed mobility comments: pt in chair  Transfers Overall transfer level: Needs assistance Equipment used: Rolling walker (2 wheeled) Transfers: Sit to/from Stand Sit to Stand: Supervision         General transfer comment: cues for overall safety and hand placement  Ambulation/Gait Ambulation/Gait assistance: Min guard;Supervision Ambulation Distance (Feet): 300 Feet Assistive device: Rolling walker (2 wheeled) Gait Pattern/deviations: Step-to pattern;Step-through pattern     General Gait Details: cues for gait progression, incr step length   Stairs Stairs: Yes   Stair Management: No rails;Step to pattern;Backwards;With walker Number of Stairs: 5 General stair comments: cues for sequence and technique  Wheelchair Mobility    Modified Rankin (Stroke Patients Only)       Balance                                            Cognition Arousal/Alertness: Awake/alert Behavior During Therapy: WFL for tasks assessed/performed Overall Cognitive Status: Within Functional Limits for tasks assessed                                        Exercises Total Joint Exercises Ankle Circles/Pumps: AROM;Both;10 reps Quad Sets:  AROM;Strengthening;Both;10 reps Heel Slides: AROM;Left;5 reps Hip ABduction/ADduction: AROM;Strengthening;Left;10 reps Long Arc Quad: AROM;Strengthening;Left;10 reps;Seated Knee Flexion: AROM;Strengthening;Left;10 reps;Standing    General Comments        Pertinent Vitals/Pain Pain Assessment: 0-10 Pain Score: 2  Faces Pain Scale: Hurts a little bit Pain Location: left hip Pain Descriptors / Indicators: Sore Pain Intervention(s): Limited activity within patient's tolerance;Monitored during session;Premedicated before session;Repositioned;Ice applied    Home Living Family/patient expects to be discharged to:: Private residence Living Arrangements: Spouse/significant other Available Help at Discharge: Family         Home Equipment: Shower seat - built in      Prior Function Level of Independence: Independent          PT Goals (current goals can now be found in the care plan section) Acute Rehab PT Goals Patient Stated Goal: return to independence PT Goal Formulation: With patient Time For Goal Achievement: 07/06/16 Potential to Achieve Goals: Good Progress towards PT goals: Progressing toward goals    Frequency    7X/week      PT Plan Current plan remains appropriate    Co-evaluation              AM-PAC PT "6 Clicks" Daily Activity  Outcome Measure  Difficulty turning over in bed (including adjusting bedclothes, sheets and blankets)?: A Little Difficulty moving from lying  on back to sitting on the side of the bed? : A Little Difficulty sitting down on and standing up from a chair with arms (e.g., wheelchair, bedside commode, etc,.)?: A Little Help needed moving to and from a bed to chair (including a wheelchair)?: A Little Help needed walking in hospital room?: A Little Help needed climbing 3-5 steps with a railing? : A Little 6 Click Score: 18    End of Session Equipment Utilized During Treatment: Gait belt Activity Tolerance: Patient tolerated  treatment well Patient left: in chair;with call bell/phone within reach Nurse Communication: Mobility status PT Visit Diagnosis: Difficulty in walking, not elsewhere classified (R26.2);Pain Pain - Right/Left: Left Pain - part of body: Hip     Time: [id number] PT Time Calculation (min) (ACUTE ONLY): 21 min  Charges:  $Gait Training: 8-22 mins                    G CodesDrucilla Chalet:       Naria Abbey, PT Pager: [phone] 07/05/2016    Drucilla ChaletWILLIAMS,Britni Driscoll 07/05/2016, 11:14 AM

## 2016-07-08 NOTE — Discharge Summary (Signed)
Physician Discharge Summary  Patient ID: Rick ModyJoseph Robey MRN: [id number] DOB/AGE: 04-14-1955 61 y.o.  Admit date: 07/04/2016 Discharge date: 07/05/2016   Procedures:  Procedure(s) (LRB): LEFT TOTAL HIP ARTHROPLASTY ANTERIOR APPROACH (Left)  Attending Physician:  Dr. Durene RomansMatthew Olin   Admission Diagnoses:   Left hip primary OA / pain  Discharge Diagnoses:  Principal Problem:   S/P left THA, AA Active Problems:   Overweight (BMI 25.0-29.9)  Past Medical History:  Diagnosis Date  . Arthritis     HPI:     Rick ModyJoseph Nuzzo, 61 y.o. male, has a history of pain and functional disability in the left hip(s) due to arthritis and patient has failed non-surgical conservative treatments for greater than 12 weeks to include NSAID's and/or analgesics, corticosteriod injections and activity modification.  Onset of symptoms was gradual starting ~15 years ago with gradually worsening course since that time.The patient noted no past surgery on the left hip(s).  Patient currently rates pain in the left hip at 7 out of 10 with activity. Patient has worsening of pain with activity and weight bearing, trendelenberg gait, pain that interfers with activities of daily living and pain with passive range of motion. Patient has evidence of periarticular osteophytes and joint space narrowing by imaging studies. This condition presents safety issues increasing the risk of falls.  There is no current active infection.   Risks, benefits and expectations were discussed with the patient.  Risks including but not limited to the risk of anesthesia, blood clots, nerve damage, blood vessel damage, failure of the prosthesis, infection and up to and including death.  Patient understand the risks, benefits and expectations and wishes to proceed with surgery.  PCP: Mila PalmerWolters, Sharon, MD   Discharged Condition: good  Hospital Course:  Patient underwent the above stated procedure on 07/04/2016. Patient tolerated the procedure well and  brought to the recovery room in good condition and subsequently to the floor.  POD #1 BP: 131/71 ; Pulse: 85 ; Temp: 97.8 F (36.6 C) ; Resp: 16 Patient reports pain as mild, pain controlled. No events throughout the night. Patient states that the hip feels good and that he is ready to recover and get better.  Ready to be discharged home. Dorsiflexion/plantar flexion intact, incision: dressing C/D/I, no cellulitis present and compartment soft.   LABS  Basename    HGB     13.5  HCT     38.6    Discharge Exam: General appearance: alert, cooperative and no distress Extremities: Homans sign is negative, no sign of DVT, no edema, redness or tenderness in the calves or thighs and no ulcers, gangrene or trophic changes  Disposition: Home with follow up in 2 weeks   Follow-up Information    Durene Romanslin, Azariyah Luhrs, MD. Schedule an appointment as soon as possible for a visit in 2 week(s).   Specialty:  Orthopedic Surgery Contact information: [address] [place] [address] [phone]           Discharge Instructions    Call MD / Call 911    Complete by:  As directed    If you experience chest pain or shortness of breath, CALL 911 and be transported to the hospital emergency room.  If you develope a fever above 101 F, pus (white drainage) or increased drainage or redness at the wound, or calf pain, call your surgeon's office.   Change dressing    Complete by:  As directed    Maintain surgical dressing until follow up in  the clinic. If the edges start to pull up, may reinforce with tape. If the dressing is no longer working, may remove and cover with gauze and tape, but must keep the area dry and clean.  Call with any questions or concerns.   Constipation Prevention    Complete by:  As directed    Drink plenty of fluids.  Prune juice may be helpful.  You may use a stool softener, such as Colace (over the counter) 100 mg twice a day.  Use MiraLax (over the counter) for  constipation as needed.   Diet - low sodium heart healthy    Complete by:  As directed    Discharge instructions    Complete by:  As directed    Maintain surgical dressing until follow up in the clinic. If the edges start to pull up, may reinforce with tape. If the dressing is no longer working, may remove and cover with gauze and tape, but must keep the area dry and clean.  Follow up in 2 weeks at [facility]. Call with any questions or concerns.   Increase activity slowly as tolerated    Complete by:  As directed    Weight bearing as tolerated with assist device (walker, cane, etc) as directed, use it as long as suggested by your surgeon or therapist, typically at least 4-6 weeks.   TED hose    Complete by:  As directed    Use stockings (TED hose) for 2 weeks on both leg(s).  You may remove them at night for sleeping.      Allergies as of 07/05/2016   No Known Allergies     Medication List    STOP taking these medications   ibuprofen 200 MG tablet Commonly known as:  ADVIL,MOTRIN     TAKE these medications   ARIPiprazole 2 MG tablet Commonly known as:  ABILIFY Take 2 mg by mouth daily.   aspirin 81 MG chewable tablet Chew 1 tablet (81 mg total) by mouth 2 (two) times daily. Take for 4 weeks.   [facility] LORATADINE-D 24 HOUR 10-240 MG 24 hr tablet Generic drug:  loratadine-pseudoephedrine Take 1 tablet by mouth every evening.   docusate sodium 100 MG capsule Commonly known as:  COLACE Take 1 capsule (100 mg total) by mouth 2 (two) times daily.   ferrous sulfate 325 (65 FE) MG tablet Commonly known as:  FERROUSUL Take 1 tablet (325 mg total) by mouth 3 (three) times daily with meals.   HYDROcodone-acetaminophen 7.5-325 MG tablet Commonly known as:  NORCO Take 1-2 tablets by mouth every 4 (four) hours as needed for moderate pain or severe pain.   methocarbamol 500 MG tablet Commonly known as:  ROBAXIN Take 1 tablet (500 mg total) by mouth every 6 (six) hours as  needed for muscle spasms.   polyethylene glycol packet Commonly known as:  MIRALAX / GLYCOLAX Take 17 g by mouth 2 (two) times daily.   sertraline 100 MG tablet Commonly known as:  ZOLOFT Take 150 mg by mouth daily.        Signed: Anastasio Auerbach. Emerita Berkemeier   PA-C  07/08/2016, 8:40 AM

## 2016-08-18 DIAGNOSIS — Z96642 Presence of left artificial hip joint: Secondary | ICD-10-CM | POA: Diagnosis not present

## 2016-08-18 DIAGNOSIS — Z471 Aftercare following joint replacement surgery: Secondary | ICD-10-CM | POA: Diagnosis not present

## 2016-09-07 DIAGNOSIS — L821 Other seborrheic keratosis: Secondary | ICD-10-CM | POA: Diagnosis not present

## 2016-09-07 DIAGNOSIS — Z85828 Personal history of other malignant neoplasm of skin: Secondary | ICD-10-CM | POA: Diagnosis not present

## 2016-09-07 DIAGNOSIS — L570 Actinic keratosis: Secondary | ICD-10-CM | POA: Diagnosis not present

## 2016-11-07 DIAGNOSIS — Z23 Encounter for immunization: Secondary | ICD-10-CM | POA: Diagnosis not present

## 2016-11-07 DIAGNOSIS — Z0000 Encounter for general adult medical examination without abnormal findings: Secondary | ICD-10-CM | POA: Diagnosis not present

## 2016-11-24 DIAGNOSIS — Z1211 Encounter for screening for malignant neoplasm of colon: Secondary | ICD-10-CM | POA: Diagnosis not present

## 2017-09-24 DIAGNOSIS — Z96642 Presence of left artificial hip joint: Secondary | ICD-10-CM | POA: Diagnosis not present

## 2017-09-24 DIAGNOSIS — M25551 Pain in right hip: Secondary | ICD-10-CM | POA: Diagnosis not present

## 2017-10-05 DIAGNOSIS — M1611 Unilateral primary osteoarthritis, right hip: Secondary | ICD-10-CM | POA: Diagnosis not present

## 2017-10-24 DIAGNOSIS — Z85828 Personal history of other malignant neoplasm of skin: Secondary | ICD-10-CM | POA: Diagnosis not present

## 2017-10-24 DIAGNOSIS — B351 Tinea unguium: Secondary | ICD-10-CM | POA: Diagnosis not present

## 2017-10-24 DIAGNOSIS — L570 Actinic keratosis: Secondary | ICD-10-CM | POA: Diagnosis not present

## 2017-11-07 DIAGNOSIS — M25551 Pain in right hip: Secondary | ICD-10-CM | POA: Diagnosis not present

## 2017-11-07 DIAGNOSIS — M1611 Unilateral primary osteoarthritis, right hip: Secondary | ICD-10-CM | POA: Diagnosis not present

## 2017-11-30 DIAGNOSIS — M1990 Unspecified osteoarthritis, unspecified site: Secondary | ICD-10-CM | POA: Diagnosis not present

## 2017-11-30 DIAGNOSIS — Z1211 Encounter for screening for malignant neoplasm of colon: Secondary | ICD-10-CM | POA: Diagnosis not present

## 2017-11-30 DIAGNOSIS — Z79899 Other long term (current) drug therapy: Secondary | ICD-10-CM | POA: Diagnosis not present

## 2017-12-03 NOTE — H&P (Signed)
TOTAL HIP ADMISSION H&P  Patient is admitted for right total hip arthroplasty, anterior approach.  Subjective:  Chief Complaint: Right hip primary OA / pain  HPI: Rick Flores, 62 y.o. male, has a history of pain and functional disability in the right hip(s) due to arthritis and patient has failed non-surgical conservative treatments for greater than 12 weeks to include NSAID's and/or analgesics, corticosteriod injections and activity modification.  Onset of symptoms was gradual starting <1 year ago with gradually worsening course since that time.The patient noted prior procedures of the hip to include arthroplasty on the left hip(s).  Patient currently rates pain in the right hip at 6 out of 10 with activity. Patient has worsening of pain with activity and weight bearing, trendelenberg gait, pain that interfers with activities of daily living and pain with passive range of motion. Patient has evidence of periarticular osteophytes and joint space narrowing by imaging studies. This condition presents safety issues increasing the risk of falls.  There is no current active infection.  Risks, benefits and expectations were discussed with the patient.  Risks including but not limited to the risk of anesthesia, blood clots, nerve damage, blood vessel damage, failure of the prosthesis, infection and up to and including death.  Patient understand the risks, benefits and expectations and wishes to proceed with surgery.   PCP: Mila Palmer, MD  D/C Plans:       Home  Post-op Meds:       No Rx given  Tranexamic Acid:      To be given - IV   Decadron:      Is to be given  FYI:      ASA  Norco  DME:   Pt already has equipment  PT:   No PT    Patient Active Problem List   Diagnosis Date Noted  . Overweight (BMI 25.0-29.9) 07/05/2016  . S/P left THA, AA 07/04/2016  . Primary osteoarthritis of left hip 04/18/2016   Past Medical History:  Diagnosis Date  . Arthritis     Past Surgical History:   Procedure Laterality Date  . 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 colonscopy  7 years ago  . TOTAL HIP ARTHROPLASTY Left 07/04/2016   Procedure: LEFT TOTAL HIP ARTHROPLASTY ANTERIOR APPROACH;  Surgeon: Durene Romans, MD;  Location: [facility];  Service: Orthopedics;  Laterality: Left;    No current facility-administered medications for this encounter.    Current Outpatient Medications  Medication Sig Dispense Refill Last Dose  . ARIPiprazole (ABILIFY) 2 MG tablet Take 2 mg by mouth daily.  12 07/04/2016 at 0630  . [facility] LORATADINE-D 24 HOUR 10-240 MG 24 hr tablet Take 1 tablet by mouth every evening.   11 07/03/2016 at 1630  . docusate sodium (COLACE) 100 MG capsule Take 1 capsule (100 mg total) by mouth 2 (two) times daily. 10 capsule 0   . ferrous sulfate (FERROUSUL) 325 (65 FE) MG tablet Take 1 tablet (325 mg total) by mouth 3 (three) times daily with meals.     Marland Kitchen HYDROcodone-acetaminophen (NORCO) 7.5-325 MG tablet Take 1-2 tablets by mouth every 4 (four) hours as needed for moderate pain or severe pain. 60 tablet 0   . methocarbamol (ROBAXIN) 500 MG tablet Take 1 tablet (500 mg total) by mouth every 6 (six) hours as needed for muscle spasms. 40 tablet 0   . polyethylene glycol (MIRALAX / GLYCOLAX) packet Take 17 g by mouth 2 (two) times daily.  14 each 0   . sertraline (ZOLOFT) 100 MG tablet Take 150 mg by mouth daily.  4 07/04/2016 at 0630   No Known Allergies   Social History   Tobacco Use  . Smoking status: Former Smoker    Years: 5.00    Types: Cigarettes    Last attempt to quit: 1986    Years since quitting: 33.8  . Smokeless tobacco: Never Used  Substance Use Topics  . Alcohol use: Yes    Alcohol/week: 10.0 - 14.0 standard drinks    Types: 5 - 7 Glasses of wine, 5 - 7 Shots of liquor per week       Review of Systems  Constitutional: Negative.   HENT: Negative.   Eyes: Negative.   Respiratory: Negative.   Cardiovascular: Negative.   Gastrointestinal:  Negative.   Genitourinary: Negative.   Musculoskeletal: Positive for joint pain.  Skin: Negative.   Neurological: Negative.   Endo/Heme/Allergies: Negative.   Psychiatric/Behavioral: Negative.     Objective:  Physical Exam  Constitutional: He is oriented to person, place, and time. He appears well-developed.  HENT:  Head: Normocephalic.  Eyes: Pupils are equal, round, and reactive to light.  Neck: Neck supple. No JVD present. No tracheal deviation present. No thyromegaly present.  Cardiovascular: Normal rate, regular rhythm and intact distal pulses.  Respiratory: Effort normal and breath sounds normal. No respiratory distress. He has no wheezes.  GI: Soft. There is no tenderness. There is no guarding.  Musculoskeletal:       Right hip: He exhibits decreased range of motion, decreased strength, tenderness and bony tenderness. He exhibits no swelling, no deformity and no laceration.  Lymphadenopathy:    He has no cervical adenopathy.  Neurological: He is alert and oriented to person, place, and time.  Skin: Skin is warm and dry.  Psychiatric: He has a normal mood and affect.      Labs:  Estimated body mass index is 26.15 kg/m as calculated from the following:   Height as of 07/04/16: 5\' 8"  (1.727 m).   Weight as of 07/04/16: 78 kg.   Imaging Review Plain radiographs demonstrate severe degenerative joint disease of the right hip. The bone quality appears to be good for age and reported activity level.    Preoperative templating of the joint replacement has been completed, documented, and submitted to the Operating Room personnel in order to optimize intra-operative equipment management.     Assessment/Plan:  End stage arthritis, right hip  The patient history, physical examination, clinical judgement of the provider and imaging studies are consistent with end stage degenerative joint disease of the right hip and total hip arthroplasty is deemed medically necessary. The  treatment options including medical management, injection therapy, arthroscopy and arthroplasty were discussed at length. The risks and benefits of total hip arthroplasty were presented and reviewed. The risks due to aseptic loosening, infection, stiffness, dislocation/subluxation,  thromboembolic complications and other imponderables were discussed.  The patient acknowledged the explanation, agreed to proceed with the plan and consent was signed. Patient is being admitted for inpatient treatment for surgery, pain control, PT, OT, prophylactic antibiotics, VTE prophylaxis, progressive ambulation and ADL's and discharge planning.The patient is planning to be discharged home.      Anastasio Auerbach Tyliyah Mcmeekin   PA-C  12/03/2017, 8:05 AM

## 2017-12-07 DIAGNOSIS — Z1211 Encounter for screening for malignant neoplasm of colon: Secondary | ICD-10-CM | POA: Diagnosis not present

## 2017-12-07 NOTE — Progress Notes (Signed)
11-30-17 Surgical clearance from Dr. Paulino Rily, EKG, CMP, CBC w/ Diff , APTT, PTT on chart

## 2017-12-07 NOTE — Patient Instructions (Signed)
Rick Flores  12/07/2017   Your procedure is scheduled on: 12-18-17     Report to [facility] Main  Entrance    Report to Admitting at 6:10 AM    Call this number if you have problems the morning of surgery [phone]     Remember: Do not eat food or drink liquids :After Midnight.    BRUSH YOUR TEETH MORNING OF SURGERY AND RINSE YOUR MOUTH OUT, NO CHEWING GUM CANDY OR MINTS.     Take these medicines the morning of surgery with A SIP OF WATER: Sertraline (Zoloft), and Aripiprazole (Abilify)                                You may not have any metal on your body including hair pins and              piercings  Do not wear jewelry, lotions, powders, cologne or deodorant             Men may shave face and neck.   Do not bring valuables to the hospital. [name] IS NOT             RESPONSIBLE   FOR VALUABLES.  Contacts, dentures or bridgework may not be worn into surgery.  Leave suitcase in the car. After surgery it may be brought to your room.     Special Instructions: N/A              Please read over the following fact sheets you were given: _____________________________________________________________________          [facility] - Preparing for Surgery Before surgery, you can play an important role.  Because skin is not sterile, your skin needs to be as free of germs as possible.  You can reduce the number of germs on your skin by washing with CHG (chlorahexidine gluconate) soap before surgery.  CHG is an antiseptic cleaner which kills germs and bonds with the skin to continue killing germs even after washing. Please DO NOT use if you have an allergy to CHG or antibacterial soaps.  If your skin becomes reddened/irritated stop using the CHG and inform your nurse when you arrive at Short Stay. Do not shave (including legs and underarms) for at least 48 hours prior to the first CHG shower.  You may shave your face/neck. Please follow these instructions  carefully:  1.  Shower with CHG Soap the night before surgery and the  morning of Surgery.  2.  If you choose to wash your hair, wash your hair first as usual with your  normal  shampoo.  3.  After you shampoo, rinse your hair and body thoroughly to remove the  shampoo.                           4.  Use CHG as you would any other liquid soap.  You can apply chg directly  to the skin and wash                       Gently with a scrungie or clean washcloth.  5.  Apply the CHG Soap to your body ONLY FROM THE NECK DOWN.   Do not use on face/ open  Wound or open sores. Avoid contact with eyes, ears mouth and genitals (private parts).                       Wash face,  Genitals (private parts) with your normal soap.             6.  Wash thoroughly, paying special attention to the area where your surgery  will be performed.  7.  Thoroughly rinse your body with warm water from the neck down.  8.  DO NOT shower/wash with your normal soap after using and rinsing off  the CHG Soap.                9.  Pat yourself dry with a clean towel.            10.  Wear clean pajamas.            11.  Place clean sheets on your bed the night of your first shower and do not  sleep with pets. Day of Surgery : Do not apply any lotions/deodorants the morning of surgery.  Please wear clean clothes to the hospital/surgery center.  FAILURE TO FOLLOW THESE INSTRUCTIONS MAY RESULT IN THE CANCELLATION OF YOUR SURGERY PATIENT SIGNATURE_________________________________  NURSE SIGNATURE__________________________________  ________________________________________________________________________   Rick Flores  An incentive spirometer is a tool that can help keep your lungs clear and active. This tool measures how well you are filling your lungs with each breath. Taking long deep breaths may help reverse or decrease the chance of developing breathing (pulmonary) problems (especially infection)  following:  A long period of time when you are unable to move or be active. BEFORE THE PROCEDURE   If the spirometer includes an indicator to show your Schertzer effort, your nurse or respiratory therapist will set it to a desired goal.  If possible, sit up straight or lean slightly forward. Try not to slouch.  Hold the incentive spirometer in an upright position. INSTRUCTIONS FOR USE  1. Sit on the edge of your bed if possible, or sit up as far as you can in bed or on a chair. 2. Hold the incentive spirometer in an upright position. 3. Breathe out normally. 4. Place the mouthpiece in your mouth and seal your lips tightly around it. 5. Breathe in slowly and as deeply as possible, raising the piston or the ball toward the top of the column. 6. Hold your breath for 3-5 seconds or for as long as possible. Allow the piston or ball to fall to the bottom of the column. 7. Remove the mouthpiece from your mouth and breathe out normally. 8. Rest for a few seconds and repeat Steps 1 through 7 at least 10 times every 1-2 hours when you are awake. Take your time and take a few normal breaths between deep breaths. 9. The spirometer may include an indicator to show your Sattler effort. Use the indicator as a goal to work toward during each repetition. 10. After each set of 10 deep breaths, practice coughing to be sure your lungs are clear. If you have an incision (the cut made at the time of surgery), support your incision when coughing by placing a pillow or rolled up towels firmly against it. Once you are able to get out of bed, walk around indoors and cough well. You may stop using the incentive spirometer when instructed by your caregiver.  RISKS AND COMPLICATIONS  Take your time so you do not get  dizzy or light-headed.  If you are in pain, you may need to take or ask for pain medication before doing incentive spirometry. It is harder to take a deep breath if you are having pain. AFTER USE  Rest and  breathe slowly and easily.  It can be helpful to keep track of a log of your progress. Your caregiver can provide you with a simple table to help with this. If you are using the spirometer at home, follow these instructions: [place] IF:   You are having difficultly using the spirometer.  You have trouble using the spirometer as often as instructed.  Your pain medication is not giving enough relief while using the spirometer.  You develop fever of 100.5 F (38.1 C) or higher. SEEK IMMEDIATE MEDICAL CARE IF:   You cough up bloody sputum that had not been present before.  You develop fever of 102 F (38.9 C) or greater.  You develop worsening pain at or near the incision site. MAKE SURE YOU:   Understand these instructions.  Will watch your condition.  Will get help right away if you are not doing well or get worse. Document Released: 06/05/2006 Document Revised: 04/17/2011 Document Reviewed: 08/06/2006 ExitCare Patient Information 2014 Exit[employer], [place].   ________________________________________________________________________  WHAT IS A BLOOD TRANSFUSION? Blood Transfusion Information  A transfusion is the replacement of blood or some of its parts. Blood is made up of multiple cells which provide different functions.  Red blood cells carry oxygen and are used for blood loss replacement.  White blood cells fight against infection.  Platelets control bleeding.  Plasma helps clot blood.  Other blood products are available for specialized needs, such as hemophilia or other clotting disorders. BEFORE THE TRANSFUSION  Who gives blood for transfusions?   Healthy volunteers who are fully evaluated to make sure their blood is safe. This is blood bank blood. Transfusion therapy is the safest it has ever been in the practice of medicine. Before blood is taken from a donor, a complete history is taken to make sure that person has no history of diseases nor engages in  risky social behavior (examples are intravenous drug use or sexual activity with multiple partners). The donor's travel history is screened to minimize risk of transmitting infections, such as malaria. The donated blood is tested for signs of infectious diseases, such as HIV and hepatitis. The blood is then tested to be sure it is compatible with you in order to minimize the chance of a transfusion reaction. If you or a relative donates blood, this is often done in anticipation of surgery and is not appropriate for emergency situations. It takes many days to process the donated blood. RISKS AND COMPLICATIONS Although transfusion therapy is very safe and saves many lives, the main dangers of transfusion include:   Getting an infectious disease.  Developing a transfusion reaction. This is an allergic reaction to something in the blood you were given. Every precaution is taken to prevent this. The decision to have a blood transfusion has been considered carefully by your caregiver before blood is given. Blood is not given unless the benefits outweigh the risks. AFTER THE TRANSFUSION  Right after receiving a blood transfusion, you will usually feel much better and more energetic. This is especially true if your red blood cells have gotten low (anemic). The transfusion raises the level of the red blood cells which carry oxygen, and this usually causes an energy increase.  The nurse administering the transfusion will  monitor you carefully for complications. HOME CARE INSTRUCTIONS  No special instructions are needed after a transfusion. You may find your energy is better. Speak with your caregiver about any limitations on activity for underlying diseases you may have. SEEK MEDICAL CARE IF:   Your condition is not improving after your transfusion.  You develop redness or irritation at the intravenous (IV) site. SEEK IMMEDIATE MEDICAL CARE IF:  Any of the following symptoms occur over the next 12  hours:  Shaking chills.  You have a temperature by mouth above 102 F (38.9 C), not controlled by medicine.  Chest, back, or muscle pain.  People around you feel you are not acting correctly or are confused.  Shortness of breath or difficulty breathing.  Dizziness and fainting.  You get a rash or develop hives.  You have a decrease in urine output.  Your urine turns a dark color or changes to pink, red, or brown. Any of the following symptoms occur over the next 10 days:  You have a temperature by mouth above 102 F (38.9 C), not controlled by medicine.  Shortness of breath.  Weakness after normal activity.  The white part of the eye turns yellow (jaundice).  You have a decrease in the amount of urine or are urinating less often.  Your urine turns a dark color or changes to pink, red, or brown. Document Released: 01/21/2000 Document Revised: 04/17/2011 Document Reviewed: 09/09/2007 [facility] Patient Information 2014 [place], [place].  _______________________________________________________________________

## 2017-12-11 ENCOUNTER — Encounter (HOSPITAL_COMMUNITY)
Admission: RE | Admit: 2017-12-11 | Discharge: 2017-12-11 | Disposition: A | Discharge status: Home / Self Care | Payer: 59 | Source: Ambulatory Visit | Attending: Orthopedic Surgery | Admitting: Orthopedic Surgery

## 2017-12-11 ENCOUNTER — Other Ambulatory Visit: Payer: Self-pay

## 2017-12-11 ENCOUNTER — Encounter (HOSPITAL_COMMUNITY): Payer: Self-pay

## 2017-12-11 DIAGNOSIS — Z01812 Encounter for preprocedural laboratory examination: Secondary | ICD-10-CM | POA: Diagnosis not present

## 2017-12-11 HISTORY — DX: Depression, unspecified: F32.A

## 2017-12-11 HISTORY — DX: Major depressive disorder, single episode, unspecified: F32.9

## 2017-12-11 LAB — SURGICAL PCR SCREEN
MRSA, PCR: NEGATIVE
Staphylococcus aureus: POSITIVE — AB

## 2017-12-11 NOTE — Progress Notes (Signed)
12-11-17 PCR result routed to Dr. Charlann Boxer for review

## 2017-12-17 NOTE — Anesthesia Preprocedure Evaluation (Addendum)
Anesthesia Evaluation  Patient identified by MRN, date of birth, ID band Patient awake    Reviewed: Allergy & Precautions, NPO status , Patient's Chart, lab work & pertinent test results  Airway Mallampati: III  TM Distance: >3 FB Neck ROM: Full    Dental no notable dental hx.    Pulmonary former smoker,    Pulmonary exam normal breath sounds clear to auscultation       Cardiovascular negative cardio ROS Normal cardiovascular exam Rhythm:Regular Rate:Normal  ECG: SB. Rate 58   Neuro/Psych PSYCHIATRIC DISORDERS Depression negative neurological ROS     GI/Hepatic negative GI ROS, Neg liver ROS,   Endo/Other  negative endocrine ROS  Renal/GU negative Renal ROS     Musculoskeletal  (+) Arthritis , Osteoarthritis,    Abdominal   Peds  Hematology negative hematology ROS (+)   Anesthesia Other Findings Right hip osteoarthritis  Reproductive/Obstetrics                            Anesthesia Physical Anesthesia Plan  ASA: II  Anesthesia Plan: Spinal   Post-op Pain Management:    Induction:   PONV Risk Score and Plan: 1 and Propofol infusion and Treatment may vary due to age or medical condition  Airway Management Planned: Natural Airway  Additional Equipment:   Intra-op Plan:   Post-operative Plan:   Informed Consent: I have reviewed the patients History and Physical, chart, labs and discussed the procedure including the risks, benefits and alternatives for the proposed anesthesia with the patient or authorized representative who has indicated his/her understanding and acceptance.   Dental advisory given  Plan Discussed with: CRNA  Anesthesia Plan Comments: (Labs from PCP Paulino Rily) reviewed)       Anesthesia Quick Evaluation

## 2017-12-18 ENCOUNTER — Other Ambulatory Visit: Payer: Self-pay

## 2017-12-18 ENCOUNTER — Inpatient Hospital Stay (HOSPITAL_COMMUNITY): Payer: 59

## 2017-12-18 ENCOUNTER — Inpatient Hospital Stay (HOSPITAL_COMMUNITY): Payer: 59 | Admitting: Anesthesiology

## 2017-12-18 ENCOUNTER — Encounter (HOSPITAL_COMMUNITY)
Admission: RE | Disposition: A | Discharge status: Home / Self Care | Payer: Self-pay | Source: Home / Self Care | Attending: Orthopedic Surgery

## 2017-12-18 ENCOUNTER — Encounter (HOSPITAL_COMMUNITY): Payer: Self-pay | Admitting: Certified Registered Nurse Anesthetist

## 2017-12-18 ENCOUNTER — Inpatient Hospital Stay (HOSPITAL_COMMUNITY)
Admission: RE | Admit: 2017-12-18 | Discharge: 2017-12-19 | DRG: 470 | Disposition: A | Discharge status: Home / Self Care | Payer: 59 | Attending: Orthopedic Surgery | Admitting: Orthopedic Surgery

## 2017-12-18 DIAGNOSIS — E663 Overweight: Secondary | ICD-10-CM | POA: Diagnosis present

## 2017-12-18 DIAGNOSIS — Z96649 Presence of unspecified artificial hip joint: Secondary | ICD-10-CM

## 2017-12-18 DIAGNOSIS — Z96642 Presence of left artificial hip joint: Secondary | ICD-10-CM | POA: Diagnosis present

## 2017-12-18 DIAGNOSIS — Z87891 Personal history of nicotine dependence: Secondary | ICD-10-CM | POA: Diagnosis not present

## 2017-12-18 DIAGNOSIS — F329 Major depressive disorder, single episode, unspecified: Secondary | ICD-10-CM | POA: Diagnosis present

## 2017-12-18 DIAGNOSIS — Z419 Encounter for procedure for purposes other than remedying health state, unspecified: Secondary | ICD-10-CM

## 2017-12-18 DIAGNOSIS — Z96641 Presence of right artificial hip joint: Secondary | ICD-10-CM

## 2017-12-18 DIAGNOSIS — Z6827 Body mass index (BMI) 27.0-27.9, adult: Secondary | ICD-10-CM

## 2017-12-18 DIAGNOSIS — M25551 Pain in right hip: Secondary | ICD-10-CM | POA: Diagnosis not present

## 2017-12-18 DIAGNOSIS — Z79899 Other long term (current) drug therapy: Secondary | ICD-10-CM

## 2017-12-18 DIAGNOSIS — M160 Bilateral primary osteoarthritis of hip: Principal | ICD-10-CM | POA: Diagnosis present

## 2017-12-18 DIAGNOSIS — M1611 Unilateral primary osteoarthritis, right hip: Secondary | ICD-10-CM | POA: Diagnosis not present

## 2017-12-18 DIAGNOSIS — Z471 Aftercare following joint replacement surgery: Secondary | ICD-10-CM | POA: Diagnosis not present

## 2017-12-18 HISTORY — PX: TOTAL HIP ARTHROPLASTY: SHX124

## 2017-12-18 LAB — TYPE AND SCREEN
ABO/RH(D): O POS
Antibody Screen: NEGATIVE

## 2017-12-18 SURGERY — ARTHROPLASTY, HIP, TOTAL, ANTERIOR APPROACH
Anesthesia: Spinal | Site: Hip | Laterality: Right

## 2017-12-18 MED ORDER — ONDANSETRON HCL 4 MG/2ML IJ SOLN
INTRAMUSCULAR | Status: DC | PRN
  Administered 2017-12-18: 4 mg via INTRAVENOUS

## 2017-12-18 MED ORDER — TRANEXAMIC ACID-NACL 1000-0.7 MG/100ML-% IV SOLN
1000.0000 mg | INTRAVENOUS | Status: AC
  Administered 2017-12-18: 1000 mg via INTRAVENOUS
  Filled 2017-12-18: qty 100

## 2017-12-18 MED ORDER — CEFAZOLIN SODIUM-DEXTROSE 2-4 GM/100ML-% IV SOLN
2.0000 g | INTRAVENOUS | Status: AC
  Administered 2017-12-18: 2 g via INTRAVENOUS
  Filled 2017-12-18: qty 100

## 2017-12-18 MED ORDER — OXYCODONE HCL 5 MG PO TABS: 5.0000 mg | ORAL_TABLET | Freq: Once | ORAL | Status: DC | PRN

## 2017-12-18 MED ORDER — HYDROCODONE-ACETAMINOPHEN 5-325 MG PO TABS
1.0000 | ORAL_TABLET | ORAL | Status: DC | PRN
  Administered 2017-12-18 (×2): 1 via ORAL
  Filled 2017-12-18 (×2): qty 1

## 2017-12-18 MED ORDER — CELECOXIB 200 MG PO CAPS
200.0000 mg | ORAL_CAPSULE | Freq: Two times a day (BID) | ORAL | Status: DC
  Administered 2017-12-18 – 2017-12-19 (×3): 200 mg via ORAL
  Filled 2017-12-18 (×3): qty 1

## 2017-12-18 MED ORDER — DOCUSATE SODIUM 100 MG PO CAPS: 100.0000 mg | ORAL_CAPSULE | Freq: Two times a day (BID) | ORAL | 0 refills | Status: DC

## 2017-12-18 MED ORDER — ARIPIPRAZOLE 2 MG PO TABS
2.0000 mg | ORAL_TABLET | Freq: Every day | ORAL | Status: DC
  Administered 2017-12-19: 2 mg via ORAL
  Filled 2017-12-18: qty 1

## 2017-12-18 MED ORDER — LIDOCAINE HCL (CARDIAC) PF 100 MG/5ML IV SOSY
PREFILLED_SYRINGE | INTRAVENOUS | Status: DC | PRN
  Administered 2017-12-18: 60 mg via INTRAVENOUS

## 2017-12-18 MED ORDER — HYDROMORPHONE HCL 1 MG/ML IJ SOLN
0.2500 mg | INTRAMUSCULAR | Status: DC | PRN
  Administered 2017-12-18 (×2): 0.5 mg via INTRAVENOUS

## 2017-12-18 MED ORDER — MENTHOL 3 MG MT LOZG: 1.0000 | LOZENGE | OROMUCOSAL | Status: DC | PRN

## 2017-12-18 MED ORDER — MIDAZOLAM HCL 5 MG/5ML IJ SOLN
INTRAMUSCULAR | Status: DC | PRN
  Administered 2017-12-18: 2 mg via INTRAVENOUS

## 2017-12-18 MED ORDER — CEFAZOLIN SODIUM-DEXTROSE 2-4 GM/100ML-% IV SOLN
2.0000 g | Freq: Four times a day (QID) | INTRAVENOUS | Status: AC
  Administered 2017-12-18 (×2): 2 g via INTRAVENOUS
  Filled 2017-12-18: qty 100

## 2017-12-18 MED ORDER — TRANEXAMIC ACID-NACL 1000-0.7 MG/100ML-% IV SOLN
1000.0000 mg | Freq: Once | INTRAVENOUS | Status: AC
  Administered 2017-12-18: 1000 mg via INTRAVENOUS
  Filled 2017-12-18: qty 100

## 2017-12-18 MED ORDER — POLYETHYLENE GLYCOL 3350 17 G PO PACK
17.0000 g | PACK | Freq: Two times a day (BID) | ORAL | Status: DC
  Administered 2017-12-18 – 2017-12-19 (×2): 17 g via ORAL
  Filled 2017-12-18 (×2): qty 1

## 2017-12-18 MED ORDER — LIDOCAINE 2% (20 MG/ML) 5 ML SYRINGE
INTRAMUSCULAR | Status: AC
  Filled 2017-12-18: qty 5

## 2017-12-18 MED ORDER — CHLORHEXIDINE GLUCONATE 4 % EX LIQD: 60.0000 mL | Freq: Once | CUTANEOUS | Status: DC

## 2017-12-18 MED ORDER — FENTANYL CITRATE (PF) 100 MCG/2ML IJ SOLN
INTRAMUSCULAR | Status: AC
  Filled 2017-12-18: qty 2

## 2017-12-18 MED ORDER — METHOCARBAMOL 500 MG IVPB - SIMPLE MED
500.0000 mg | Freq: Four times a day (QID) | INTRAVENOUS | Status: DC | PRN
  Filled 2017-12-18: qty 50

## 2017-12-18 MED ORDER — OXYCODONE HCL 5 MG/5ML PO SOLN: 5.0000 mg | Freq: Once | ORAL | Status: DC | PRN

## 2017-12-18 MED ORDER — PHENOL 1.4 % MT LIQD: 1.0000 | OROMUCOSAL | Status: DC | PRN

## 2017-12-18 MED ORDER — DEXAMETHASONE SODIUM PHOSPHATE 10 MG/ML IJ SOLN
10.0000 mg | Freq: Once | INTRAMUSCULAR | Status: AC
  Administered 2017-12-18: 10 mg via INTRAVENOUS

## 2017-12-18 MED ORDER — BISACODYL 10 MG RE SUPP: 10.0000 mg | Freq: Every day | RECTAL | Status: DC | PRN

## 2017-12-18 MED ORDER — ALUM & MAG HYDROXIDE-SIMETH 200-200-20 MG/5ML PO SUSP: 15.0000 mL | ORAL | Status: DC | PRN

## 2017-12-18 MED ORDER — DOCUSATE SODIUM 100 MG PO CAPS
100.0000 mg | ORAL_CAPSULE | Freq: Two times a day (BID) | ORAL | Status: DC
  Administered 2017-12-18 – 2017-12-19 (×3): 100 mg via ORAL
  Filled 2017-12-18 (×3): qty 1

## 2017-12-18 MED ORDER — FERROUS SULFATE 325 (65 FE) MG PO TABS
325.0000 mg | ORAL_TABLET | Freq: Three times a day (TID) | ORAL | Status: DC
  Administered 2017-12-19: 325 mg via ORAL
  Filled 2017-12-18: qty 1

## 2017-12-18 MED ORDER — SODIUM CHLORIDE 0.9 % IR SOLN
Status: DC | PRN
  Administered 2017-12-18: 1000 mL

## 2017-12-18 MED ORDER — ONDANSETRON HCL 4 MG PO TABS: 4.0000 mg | ORAL_TABLET | Freq: Four times a day (QID) | ORAL | Status: DC | PRN

## 2017-12-18 MED ORDER — PROPOFOL 10 MG/ML IV BOLUS
INTRAVENOUS | Status: AC
  Filled 2017-12-18: qty 80

## 2017-12-18 MED ORDER — MAGNESIUM CITRATE PO SOLN: 1.0000 | Freq: Once | ORAL | Status: DC | PRN

## 2017-12-18 MED ORDER — HYDROCODONE-ACETAMINOPHEN 7.5-325 MG PO TABS
1.0000 | ORAL_TABLET | ORAL | Status: DC | PRN
  Administered 2017-12-18: 1 via ORAL
  Administered 2017-12-18 – 2017-12-19 (×3): 2 via ORAL
  Filled 2017-12-18: qty 1
  Filled 2017-12-18 (×3): qty 2

## 2017-12-18 MED ORDER — METOCLOPRAMIDE HCL 5 MG PO TABS: 5.0000 mg | ORAL_TABLET | Freq: Three times a day (TID) | ORAL | Status: DC | PRN

## 2017-12-18 MED ORDER — HYDROMORPHONE HCL 1 MG/ML IJ SOLN
0.5000 mg | INTRAMUSCULAR | Status: DC | PRN
  Administered 2017-12-18 (×2): 1 mg via INTRAVENOUS
  Administered 2017-12-18: 0.5 mg via INTRAVENOUS
  Filled 2017-12-18 (×3): qty 1

## 2017-12-18 MED ORDER — SODIUM CHLORIDE 0.9 % IV SOLN
INTRAVENOUS | Status: DC
  Administered 2017-12-18 (×2): via INTRAVENOUS

## 2017-12-18 MED ORDER — ACETAMINOPHEN 325 MG PO TABS: 325.0000 mg | ORAL_TABLET | Freq: Four times a day (QID) | ORAL | Status: DC | PRN

## 2017-12-18 MED ORDER — HYDROMORPHONE HCL 1 MG/ML IJ SOLN
INTRAMUSCULAR | Status: AC
  Filled 2017-12-18: qty 1

## 2017-12-18 MED ORDER — HYDROCODONE-ACETAMINOPHEN 7.5-325 MG PO TABS: 1.0000 | ORAL_TABLET | ORAL | 0 refills | Status: DC | PRN

## 2017-12-18 MED ORDER — DEXAMETHASONE SODIUM PHOSPHATE 10 MG/ML IJ SOLN
10.0000 mg | Freq: Once | INTRAMUSCULAR | Status: AC
  Administered 2017-12-19: 10 mg via INTRAVENOUS
  Filled 2017-12-18: qty 1

## 2017-12-18 MED ORDER — METHOCARBAMOL 500 MG PO TABS: 500.0000 mg | ORAL_TABLET | Freq: Four times a day (QID) | ORAL | 0 refills | Status: DC | PRN

## 2017-12-18 MED ORDER — SERTRALINE HCL 50 MG PO TABS
150.0000 mg | ORAL_TABLET | Freq: Every day | ORAL | Status: DC
  Administered 2017-12-19: 150 mg via ORAL
  Filled 2017-12-18: qty 1

## 2017-12-18 MED ORDER — PROMETHAZINE HCL 25 MG/ML IJ SOLN: 6.2500 mg | INTRAMUSCULAR | Status: DC | PRN

## 2017-12-18 MED ORDER — ASPIRIN 81 MG PO CHEW: 81.0000 mg | CHEWABLE_TABLET | Freq: Two times a day (BID) | ORAL | 0 refills | Status: AC

## 2017-12-18 MED ORDER — POLYETHYLENE GLYCOL 3350 17 G PO PACK: 17.0000 g | PACK | Freq: Two times a day (BID) | ORAL | 0 refills | Status: DC

## 2017-12-18 MED ORDER — LACTATED RINGERS IV SOLN
INTRAVENOUS | Status: DC
  Administered 2017-12-18 (×2): via INTRAVENOUS

## 2017-12-18 MED ORDER — ONDANSETRON HCL 4 MG/2ML IJ SOLN: 4.0000 mg | Freq: Four times a day (QID) | INTRAMUSCULAR | Status: DC | PRN

## 2017-12-18 MED ORDER — ASPIRIN 81 MG PO CHEW
81.0000 mg | CHEWABLE_TABLET | Freq: Two times a day (BID) | ORAL | Status: DC
  Administered 2017-12-18 – 2017-12-19 (×2): 81 mg via ORAL
  Filled 2017-12-18 (×2): qty 1

## 2017-12-18 MED ORDER — ONDANSETRON HCL 4 MG/2ML IJ SOLN
INTRAMUSCULAR | Status: AC
  Filled 2017-12-18: qty 2

## 2017-12-18 MED ORDER — DEXAMETHASONE SODIUM PHOSPHATE 10 MG/ML IJ SOLN
INTRAMUSCULAR | Status: AC
  Filled 2017-12-18: qty 1

## 2017-12-18 MED ORDER — DIPHENHYDRAMINE HCL 12.5 MG/5ML PO ELIX: 12.5000 mg | ORAL_SOLUTION | ORAL | Status: DC | PRN

## 2017-12-18 MED ORDER — FENTANYL CITRATE (PF) 100 MCG/2ML IJ SOLN
INTRAMUSCULAR | Status: DC | PRN
  Administered 2017-12-18: 100 ug via INTRAVENOUS

## 2017-12-18 MED ORDER — BUPIVACAINE IN DEXTROSE 0.75-8.25 % IT SOLN
INTRATHECAL | Status: DC | PRN
  Administered 2017-12-18: 2 mL via INTRATHECAL

## 2017-12-18 MED ORDER — FERROUS SULFATE 325 (65 FE) MG PO TABS: 325.0000 mg | ORAL_TABLET | Freq: Three times a day (TID) | ORAL | 3 refills | Status: DC

## 2017-12-18 MED ORDER — MIDAZOLAM HCL 2 MG/2ML IJ SOLN
INTRAMUSCULAR | Status: AC
  Filled 2017-12-18: qty 2

## 2017-12-18 MED ORDER — METOCLOPRAMIDE HCL 5 MG/ML IJ SOLN: 5.0000 mg | Freq: Three times a day (TID) | INTRAMUSCULAR | Status: DC | PRN

## 2017-12-18 MED ORDER — METHOCARBAMOL 500 MG PO TABS
500.0000 mg | ORAL_TABLET | Freq: Four times a day (QID) | ORAL | Status: DC | PRN
  Administered 2017-12-18 – 2017-12-19 (×2): 500 mg via ORAL
  Filled 2017-12-18 (×2): qty 1

## 2017-12-18 MED ORDER — METHOCARBAMOL 500 MG IVPB - SIMPLE MED
INTRAVENOUS | Status: AC
  Filled 2017-12-18: qty 50

## 2017-12-18 MED ORDER — PROPOFOL 500 MG/50ML IV EMUL
INTRAVENOUS | Status: DC | PRN
  Administered 2017-12-18: 100 ug/kg/min via INTRAVENOUS

## 2017-12-18 MED ORDER — PROPOFOL 10 MG/ML IV BOLUS
INTRAVENOUS | Status: DC | PRN
  Administered 2017-12-18: 20 mg via INTRAVENOUS
  Administered 2017-12-18: 30 mg via INTRAVENOUS

## 2017-12-18 SURGICAL SUPPLY — 43 items
ADH SKN CLS APL DERMABOND .7 (GAUZE/BANDAGES/DRESSINGS) ×1
BAG DECANTER FOR FLEXI CONT (MISCELLANEOUS) IMPLANT
BAG SPEC THK2 15X12 ZIP CLS (MISCELLANEOUS)
BAG ZIPLOCK 12X15 (MISCELLANEOUS) IMPLANT
BLADE SAG 18X100X1.27 (BLADE) ×3 IMPLANT
COVER PERINEAL POST (MISCELLANEOUS) ×3 IMPLANT
COVER SURGICAL LIGHT HANDLE (MISCELLANEOUS) ×3 IMPLANT
COVER WAND RF STERILE (DRAPES) ×2 IMPLANT
CUP ACETBLR 54 OD PINNACLE (Hips) ×2 IMPLANT
DERMABOND ADVANCED (GAUZE/BANDAGES/DRESSINGS) ×2
DERMABOND ADVANCED .7 DNX12 (GAUZE/BANDAGES/DRESSINGS) ×1 IMPLANT
DRAPE STERI IOBAN 125X83 (DRAPES) ×3 IMPLANT
DRAPE U-SHAPE 47X51 STRL (DRAPES) ×6 IMPLANT
DRESSING AQUACEL AG SP 3.5X10 (GAUZE/BANDAGES/DRESSINGS) ×1 IMPLANT
DRSG AQUACEL AG SP 3.5X10 (GAUZE/BANDAGES/DRESSINGS) ×3
DURAPREP 26ML APPLICATOR (WOUND CARE) ×3 IMPLANT
ELECT REM PT RETURN 15FT ADLT (MISCELLANEOUS) ×3 IMPLANT
ELIMINATOR HOLE APEX DEPUY (Hips) ×2 IMPLANT
GLOVE BIOGEL M STRL SZ7.5 (GLOVE) ×2 IMPLANT
GLOVE BIOGEL PI IND STRL 7.5 (GLOVE) ×1 IMPLANT
GLOVE BIOGEL PI IND STRL 8.5 (GLOVE) ×1 IMPLANT
GLOVE BIOGEL PI INDICATOR 7.5 (GLOVE) ×2
GLOVE BIOGEL PI INDICATOR 8.5 (GLOVE) ×2
GLOVE ECLIPSE 8.0 STRL XLNG CF (GLOVE) ×6 IMPLANT
GLOVE ORTHO TXT STRL SZ7.5 (GLOVE) ×3 IMPLANT
GOWN STRL REUS W/TWL 2XL LVL3 (GOWN DISPOSABLE) ×3 IMPLANT
GOWN STRL REUS W/TWL LRG LVL3 (GOWN DISPOSABLE) ×3 IMPLANT
HEAD CERAMIC DELTA 36 PLUS 1.5 (Hips) ×2 IMPLANT
HOLDER FOLEY CATH W/STRAP (MISCELLANEOUS) ×3 IMPLANT
LINER NEUTRAL 54X36MM PLUS 4 (Hips) ×2 IMPLANT
PACK ANTERIOR HIP CUSTOM (KITS) ×3 IMPLANT
SCREW PINN CAN 6.5X20 (Screw) ×2 IMPLANT
STEM TRI LOC BPS SZ3 W GRIPTON IMPLANT
SUT MNCRL AB 4-0 PS2 18 (SUTURE) ×3 IMPLANT
SUT STRATAFIX 0 PDS 27 VIOLET (SUTURE) ×3
SUT VIC AB 1 CT1 36 (SUTURE) ×9 IMPLANT
SUT VIC AB 2-0 CT1 27 (SUTURE) ×6
SUT VIC AB 2-0 CT1 TAPERPNT 27 (SUTURE) ×2 IMPLANT
SUTURE STRATFX 0 PDS 27 VIOLET (SUTURE) ×1 IMPLANT
TRAY FOLEY MTR SLVR 16FR STAT (SET/KITS/TRAYS/PACK) ×2 IMPLANT
TRI LOC BPS SZ 3 W GRIPTON ×3 IMPLANT
WATER STERILE IRR 1000ML POUR (IV SOLUTION) ×3 IMPLANT
YANKAUER SUCT BULB TIP 10FT TU (MISCELLANEOUS) IMPLANT

## 2017-12-18 NOTE — Evaluation (Signed)
Physical Therapy Evaluation Patient Details Name: Rick Flores MRN: [id number] DOB: [birth year] Today's Date: 12/18/2017   History of Present Illness  62 yo male s/p R DA-THA on 12/18/17. PMH includes L index finger distal phalanx amputation, L THA 2018.   Clinical Impression  Pt presents with R hip pain, increased time and effort to perform mobility tasks, and decreased tolerance for activity due to pain. Pt to benefit from acute PT to address deficits. Pt ambulated 60 ft with RW with min guard assist. Pt educated on quad sets (5-10/hour), ankle pumps (20/hour), and heel slides (5-10/hour) to perform this afternoon/evening to lessen stiffness and increase circulation, to pt's tolerance and limited by pain. PT to progress mobility as tolerated, and will continue to follow acutely.      Follow Up Recommendations Follow surgeon's recommendation for DC plan and follow-up therapies;Supervision for mobility/OOB;No PT follow up(HEP)    Equipment Recommendations  None recommended by PT(Pt has rollator, typically would recommend RW but pt reports only using rollator for 1 day with last hip replacement)    Recommendations for Other Services       Precautions / Restrictions Precautions Precautions: Fall Restrictions Weight Bearing Restrictions: No Other Position/Activity Restrictions: WBAT       Mobility  Bed Mobility Overal bed mobility: Needs Assistance Bed Mobility: Supine to Sit;Sit to Supine     Supine to sit: HOB elevated;Min assist Sit to supine: Min guard;HOB elevated   General bed mobility comments: Min guard for safety, and min assist for RLE lowering when sitting EOB. VC for sequencing. Increased time and effort.   Transfers Overall transfer level: Needs assistance Equipment used: Rolling walker (2 wheeled) Transfers: Sit to/from Stand Sit to Stand: Min guard;From elevated surface         General transfer comment: Min guard for safety. verbal cuing for hand placement.    Ambulation/Gait Ambulation/Gait assistance: Min guard Gait Distance (Feet): 60 Feet Assistive device: Rolling walker (2 wheeled) Gait Pattern/deviations: Step-to pattern;Decreased stride length;Decreased weight shift to right;Decreased stance time - right;Antalgic Gait velocity: decr    General Gait Details: Min guard for safety. Verbal cuing for placement in RW, sequencing, turning.   Stairs            Wheelchair Mobility    Modified Rankin (Stroke Patients Only)       Balance Overall balance assessment: Mild deficits observed, not formally tested                                           Pertinent Vitals/Pain Pain Assessment: 0-10 Pain Score: 6  Pain Location: R hip  Pain Descriptors / Indicators: Sore Pain Intervention(s): Limited activity within patient's tolerance;Premedicated before session;Repositioned;Ice applied    Home Living Family/patient expects to be discharged to:: Private residence Living Arrangements: Spouse/significant other Available Help at Discharge: Available PRN/intermittently;Family Type of Home: House Home Access: Stairs to enter Entrance Stairs-Rails: None Entrance Stairs-Number of Steps: 4 Home Layout: One level Home Equipment: Shower seat - built in;Other (comment);Walker - 4 wheels(walking stick)      Prior Function Level of Independence: Independent               Hand Dominance   Dominant Hand: Right    Extremity/Trunk Assessment   Upper Extremity Assessment Upper Extremity Assessment: Overall WFL for tasks assessed    Lower Extremity Assessment Lower Extremity Assessment: Overall WFL for  tasks assessed;RLE deficits/detail RLE Deficits / Details: Suspected post-surgical hip weakness; able to perform SLR, quad set, ankle pumps, heel slide RLE Sensation: WNL    Cervical / Trunk Assessment Cervical / Trunk Assessment: Normal  Communication   Communication: No difficulties  Cognition  Arousal/Alertness: Awake/alert Behavior During Therapy: WFL for tasks assessed/performed Overall Cognitive Status: Within Functional Limits for tasks assessed                                        General Comments      Exercises     Assessment/Plan    PT Assessment Patient needs continued PT services  PT Problem List Decreased strength;Pain;Decreased range of motion;Decreased activity tolerance;Decreased knowledge of use of DME;Decreased balance;Decreased safety awareness;Decreased mobility       PT Treatment Interventions DME instruction;Therapeutic activities;Gait training;Therapeutic exercise;Patient/family education;Stair training;Balance training;Functional mobility training    PT Goals (Current goals can be found in the Care Plan section)  Acute Rehab PT Goals Patient Stated Goal: none stated  PT Goal Formulation: With patient Time For Goal Achievement: 01/01/18 Potential to Achieve Goals: Good    Frequency 7X/week   Barriers to discharge        Co-evaluation               AM-PAC PT "6 Clicks" Daily Activity  Outcome Measure Difficulty turning over in bed (including adjusting bedclothes, sheets and blankets)?: Unable Difficulty moving from lying on back to sitting on the side of the bed? : Unable Difficulty sitting down on and standing up from a chair with arms (e.g., wheelchair, bedside commode, etc,.)?: Unable Help needed moving to and from a bed to chair (including a wheelchair)?: A Little Help needed walking in hospital room?: A Little Help needed climbing 3-5 steps with a railing? : A Little 6 Click Score: 12    End of Session Equipment Utilized During Treatment: Gait belt Activity Tolerance: Patient tolerated treatment well;Patient limited by pain Patient left: in bed;with call bell/phone within reach;with SCD's reapplied Nurse Communication: Mobility status;Patient requests pain meds PT Visit Diagnosis: Other abnormalities of gait  and mobility (R26.89);Difficulty in walking, not elsewhere classified (R26.2)    Time: 4098-1191 PT Time Calculation (min) (ACUTE ONLY): 23 min   Charges:   PT Evaluation $PT Eval Low Complexity: 1 Low PT Treatments $Gait Training: 8-22 mins      Nicola Police, PT Acute Rehabilitation Services Pager [phone]  Office [phone]  Detta Mellin D Despina Hidden 12/18/2017, 4:34 PM

## 2017-12-18 NOTE — Discharge Instructions (Signed)

## 2017-12-18 NOTE — Plan of Care (Signed)
Plan of care 

## 2017-12-18 NOTE — Anesthesia Postprocedure Evaluation (Signed)
Anesthesia Post Note  Patient: Rick ModyJoseph Flores  Procedure(s) Performed: RIGHT TOTAL HIP ARTHROPLASTY ANTERIOR APPROACH (Right Hip)     Patient location during evaluation: PACU Anesthesia Type: Spinal Level of consciousness: oriented and awake and alert Pain management: pain level controlled Vital Signs Assessment: post-procedure vital signs reviewed and stable Respiratory status: spontaneous breathing, respiratory function stable and patient connected to nasal cannula oxygen Cardiovascular status: blood pressure returned to baseline and stable Postop Assessment: no headache, no backache, no apparent nausea or vomiting and spinal receding Anesthetic complications: no    Last Vitals:  Vitals:   12/18/17 1347 12/18/17 1431  BP: 120/77 131/86  Pulse: 68 75  Resp: 18 18  Temp: 36.6 C   SpO2: 99% 100%    Last Pain:  Vitals:   12/18/17 1348  TempSrc:   PainSc: 6                  Ryan P Ellender

## 2017-12-18 NOTE — Op Note (Signed)
NAME:  Rick Flores                ACCOUNT NO.: [id number]      MEDICAL RECORD NO.: [id number]      FACILITY:  [facility]      PHYSICIAN:  Shelda Pal  DATE OF BIRTH:  1955-03-20     DATE OF PROCEDURE:  12/18/2017                                 OPERATIVE REPORT         PREOPERATIVE DIAGNOSIS: Right  hip osteoarthritis.      POSTOPERATIVE DIAGNOSIS:  Right hip osteoarthritis.      PROCEDURE:  Right total hip replacement through an anterior approach   utilizing DePuy THR system, component size 54mm pinnacle cup, a size 36+4 neutral   Altrex liner, a size 3 Hi Tri Lock stem with a 35+1.5 delta ceramic   ball.      SURGEON:  Madlyn Frankel. Charlann Boxer, M.D.      ASSISTANT:  Skip Mayer, PA-C     ANESTHESIA:  Spinal.      SPECIMENS:  None.      COMPLICATIONS:  None.      BLOOD LOSS:  650 cc     DRAINS:  None.      INDICATION OF THE PROCEDURE:  Rick Flores is a 62 y.o. male who had   presented to office for evaluation of right hip pain.  Radiographs revealed   progressive degenerative changes with bone-on-bone   articulation of the  hip joint, including subchondral cystic changes and osteophytes.  The patient had painful limited range of   motion significantly affecting their overall quality of life and function.  The patient was failing to    respond to conservative measures including medications and/or injections and activity modification and at this point was ready   to proceed with more definitive measures.  Consent was obtained for   benefit of pain relief.  Specific risks of infection, DVT, component   failure, dislocation, neurovascular injury, and need for revision surgery were reviewed in the office as well discussion of   the anterior versus posterior approach were reviewed.     PROCEDURE IN DETAIL:  The patient was brought to operative theater.   Once adequate anesthesia, preoperative antibiotics, 2 gm of Ancef, 1 gm of Tranexamic Acid, and 10 mg of  Decadron were administered, the patient was positioned supine on the [employer] table.  Once the patient was safely positioned with adequate padding of boney prominences we predraped out the hip, and used fluoroscopy to confirm orientation of the pelvis.      The right hip was then prepped and draped from proximal iliac crest to   mid thigh with a shower curtain technique.      Time-out was performed identifying the patient, planned procedure, and the appropriate extremity.     An incision was then made 2 cm lateral to the   anterior superior iliac spine extending over the orientation of the   tensor fascia lata muscle and sharp dissection was carried down to the   fascia of the muscle.      The fascia was then incised.  The muscle belly was identified and swept   laterally and retractor placed along the superior neck.  Following   cauterization of the circumflex vessels and removing some pericapsular  fat, a second cobra retractor was placed on the inferior neck.  A T-capsulotomy was made along the line of the   superior neck to the trochanteric fossa, then extended proximally and   distally.  Tag sutures were placed and the retractors were then placed   intracapsular.  We then identified the trochanteric fossa and   orientation of my neck cut and then made a neck osteotomy with the femur on traction.  The femoral   head was removed without difficulty or complication.  Traction was let   off and retractors were placed posterior and anterior around the   acetabulum.      The labrum and foveal tissue were debrided.  I began reaming with a 46 mm   reamer and reamed up to 53 mm reamer with good bony bed preparation and a 54 mm  cup was chosen.  The final 54 mm Pinnacle cup was then impacted under fluoroscopy to confirm the depth of penetration and orientation with respect to   Abduction and forward flexion.  A screw was placed into the ilium followed by the hole eliminator.  The final   36+4  neutral Altrex liner was impacted with good visualized rim fit.  The cup was positioned anatomically within the acetabular portion of the pelvis.      At this point, the femur was rolled to 100 degrees.  Further capsule was   released off the inferior aspect of the femoral neck.  I then   released the superior capsule proximally.  With the leg in a neutral position the hook was placed laterally   along the femur under the vastus lateralis origin and elevated manually and then held in position using the hook attachment on the bed.  The leg was then extended and adducted with the leg rolled to 100   degrees of external rotation.  Retractors were placed along the medial calcar and posteriorly over the greater trochanter.  Once the proximal femur was fully   exposed, I used a box osteotome to set orientation.  I then began   broaching with the starting chili pepper broach and passed this by hand and then broached up to 3 (matching the other hip).  With the 3 broach in place I chose a high offset neck and did several trial reductions.  The offset was appropriate, leg lengths   appeared to be equal Orzechowski matched with the +1.5 head ball trial confirmed radiographically.   Given these findings, I went ahead and dislocated the hip, repositioned all   retractors and positioned the right hip in the extended and abducted position.  The final 3 Hi Tri Lock stem was   chosen and it was impacted down to the level of neck cut.  Based on this   and the trial reductions, a final 36+1.5 delta ceramic ball was chosen and   impacted onto a clean and dry trunnion, and the hip was reduced.  The   hip had been irrigated throughout the case again at this point.  I did   reapproximate the superior capsular leaflet to the anterior leaflet   using #1 Vicryl.  The fascia of the   tensor fascia lata muscle was then reapproximated using #1 Vicryl and #0 Stratafix sutures.  The   remaining wound was closed with 2-0 Vicryl and  running 4-0 Monocryl.   The hip was cleaned, dried, and dressed sterilely using Dermabond and   Aquacel dressing.  The patient was then brought  to recovery room in stable condition tolerating the procedure well.    Skip MayerBlair Roberts, PA-C was present for the entirety of the case involved from   preoperative positioning, perioperative retractor management, general   facilitation of the case, as well as primary wound closure as assistant.            Madlyn FrankelMatthew D. Charlann Boxerlin, M.D.        12/18/2017 10:02 AM

## 2017-12-18 NOTE — Interval H&P Note (Signed)
History and Physical Interval Note:  12/18/2017 7:03 AM  Rick Flores  has presented today for surgery, with the diagnosis of Right hip osteoarthritis  The various methods of treatment have been discussed with the patient and family. After consideration of risks, benefits and other options for treatment, the patient has consented to  Procedure(s) with comments: RIGHT TOTAL HIP ARTHROPLASTY ANTERIOR APPROACH (Right) - 70 mins as a surgical intervention .  The patient's history has been reviewed, patient examined, no change in status, stable for surgery.  I have reviewed the patient's chart and labs.  Questions were answered to the patient's satisfaction.     Shelda Pal

## 2017-12-18 NOTE — Anesthesia Procedure Notes (Signed)
Spinal  Patient location during procedure: OR End time: 12/18/2017 8:48 AM Staffing Resident/CRNA: Caryl Pina T, CRNA Performed: resident/CRNA  Preanesthetic Checklist Completed: patient identified, site marked, surgical consent, pre-op evaluation, timeout performed, IV checked, risks and benefits discussed and monitors and equipment checked Spinal Block Patient position: sitting Prep: DuraPrep Patient monitoring: heart rate, cardiac monitor, blood pressure and continuous pulse ox Approach: midline Location: L3-4 Injection technique: single-shot Needle Needle type: Pencan  Needle gauge: 24 G Needle length: 9 cm Assessment Sensory level: T6 Additional Notes Expiration date of kit checked and confirmed. Patient tolerated procedure well, without complications.

## 2017-12-18 NOTE — Transfer of Care (Signed)
Immediate Anesthesia Transfer of Care Note  Patient: Rick ModyJoseph Flores  Procedure(s) Performed: RIGHT TOTAL HIP ARTHROPLASTY ANTERIOR APPROACH (Right Hip)  Patient Location: PACU  Anesthesia Type:Spinal  Level of Consciousness: awake, alert  and oriented  Airway & Oxygen Therapy: Patient Spontanous Breathing and Patient connected to face mask oxygen  Post-op Assessment: Report given to RN and Post -op Vital signs reviewed and stable  Post vital signs: Reviewed and stable  Last Vitals:  Vitals Value Taken Time  BP 117/83 12/18/2017 10:41 AM  Temp    Pulse 53 12/18/2017 10:42 AM  Resp 13 12/18/2017 10:42 AM  SpO2 100 % 12/18/2017 10:42 AM  Vitals shown include unvalidated device data.  Last Pain:  Vitals:   12/18/17 0639  TempSrc: Oral      Patients Stated Pain Goal: 5 (12/18/17 [id number])  Complications: No apparent anesthesia complications

## 2017-12-19 ENCOUNTER — Encounter (HOSPITAL_COMMUNITY): Payer: Self-pay | Admitting: Orthopedic Surgery

## 2017-12-19 LAB — CBC
HCT: 38.7 % — ABNORMAL LOW (ref 39.0–52.0)
Hemoglobin: 13.4 g/dL (ref 13.0–17.0)
MCH: 33.8 pg (ref 26.0–34.0)
MCHC: 34.6 g/dL (ref 30.0–36.0)
MCV: 97.5 fL (ref 80.0–100.0)
NRBC: 0 % (ref 0.0–0.2)
PLATELETS: 181 10*3/uL (ref 150–400)
RBC: 3.97 MIL/uL — ABNORMAL LOW (ref 4.22–5.81)
RDW: 11.5 % (ref 11.5–15.5)
WBC: 13.2 10*3/uL — AB (ref 4.0–10.5)

## 2017-12-19 LAB — BASIC METABOLIC PANEL
ANION GAP: 6 (ref 5–15)
BUN: 13 mg/dL (ref 6–20)
CO2: 29 mmol/L (ref 22–32)
CREATININE: 0.78 mg/dL (ref 0.61–1.24)
Calcium: 8.3 mg/dL — ABNORMAL LOW (ref 8.9–10.3)
Chloride: 104 mmol/L (ref 98–111)
Glucose, Bld: 133 mg/dL — ABNORMAL HIGH (ref 70–99)
Potassium: 4.1 mmol/L (ref 3.5–5.1)
Sodium: 139 mmol/L (ref 135–145)

## 2017-12-19 NOTE — Discharge Summary (Signed)
Physician Discharge Summary  Patient ID: Rick Flores MRN: [id number] DOB/AGE: [birth year] 62 y.o.  Admit date: 12/18/2017 Discharge date: 12/19/2017   Procedures:  Procedure(s) (LRB): RIGHT TOTAL HIP ARTHROPLASTY ANTERIOR APPROACH (Right)  Attending Physician:  Dr. Durene Romans   Admission Diagnoses:   Right hip primary OA / pain  Discharge Diagnoses:  Principal Problem:   S/P right THA, AA Active Problems:   Overweight (BMI 25.0-29.9)  Past Medical History:  Diagnosis Date  . Arthritis   . Depression     HPI:    Rick Flores, 62 y.o. male, has a history of pain and functional disability in the right hip(s) due to arthritis and patient has failed non-surgical conservative treatments for greater than 12 weeks to include NSAID's and/or analgesics, corticosteriod injections and activity modification.  Onset of symptoms was gradual starting <1 year ago with gradually worsening course since that time.The patient noted prior procedures of the hip to include arthroplasty on the left hip(s).  Patient currently rates pain in the right hip at 6 out of 10 with activity. Patient has worsening of pain with activity and weight bearing, trendelenberg gait, pain that interfers with activities of daily living and pain with passive range of motion. Patient has evidence of periarticular osteophytes and joint space narrowing by imaging studies. This condition presents safety issues increasing the risk of falls. There is no current active infection.  Risks, benefits and expectations were discussed with the patient.  Risks including but not limited to the risk of anesthesia, blood clots, nerve damage, blood vessel damage, failure of the prosthesis, infection and up to and including death.  Patient understand the risks, benefits and expectations and wishes to proceed with surgery.   PCP: Mila Palmer, MD   Discharged Condition: good  Hospital Course:  Patient underwent the above stated procedure on  12/18/2017. Patient tolerated the procedure well and brought to the recovery room in good condition and subsequently to the floor.  POD #1 BP: 131/86 ; Pulse: 84 ; Temp: 98 F (36.7 C) ; Resp: 18 Patient reports pain as mild, pain controlled.  Pain a little more than last after last surgery, but still feels he is doing well.  No events throughout the night. Ready to be discharged home. Dorsiflexion/plantar flexion intact, incision: dressing C/D/I, no cellulitis present and compartment soft.   LABS  Basename    HGB     13.4  HCT     38.7    Discharge Exam: General appearance: alert, cooperative and no distress Extremities: Homans sign is negative, no sign of DVT, no edema, redness or tenderness in the calves or thighs and no ulcers, gangrene or trophic changes  Disposition:  Home with follow up in 2 weeks   Follow-up Information    Durene Romans, MD. Schedule an appointment as soon as possible for a visit in 2 weeks.   Specialty:  Orthopedic Surgery Contact information: [address] [place] [address] [phone]           Discharge Instructions    Call MD / Call 911   Complete by:  As directed    If you experience chest pain or shortness of breath, CALL 911 and be transported to the hospital emergency room.  If you develope a fever above 101 F, pus (white drainage) or increased drainage or redness at the wound, or calf pain, call your surgeon's office.   Change dressing   Complete by:  As directed    Maintain  surgical dressing until follow up in the clinic. If the edges start to pull up, may reinforce with tape. If the dressing is no longer working, may remove and cover with gauze and tape, but must keep the area dry and clean.  Call with any questions or concerns.   Constipation Prevention   Complete by:  As directed    Drink plenty of fluids.  Prune juice may be helpful.  You may use a stool softener, such as Colace (over the counter) 100 mg twice a  day.  Use MiraLax (over the counter) for constipation as needed.   Diet - low sodium heart healthy   Complete by:  As directed    Discharge instructions   Complete by:  As directed    Maintain surgical dressing until follow up in the clinic. If the edges start to pull up, may reinforce with tape. If the dressing is no longer working, may remove and cover with gauze and tape, but must keep the area dry and clean.  [facility]. Call with any questions or concerns.   Increase activity slowly as tolerated   Complete by:  As directed    Weight bearing as tolerated with assist device (walker, cane, etc) as directed, use it as long as suggested by your surgeon or therapist, typically at least 4-6 weeks.   TED hose   Complete by:  As directed    Use stockings (TED hose) for 2 weeks on both leg(s).  You may remove them at night for sleeping.      Allergies as of 12/19/2017   No Known Allergies     Medication List    TAKE these medications   ARIPiprazole 2 MG tablet Commonly known as:  ABILIFY Take 2 mg by mouth daily.   aspirin 81 MG chewable tablet Chew 1 tablet (81 mg total) by mouth 2 (two) times daily. Take for 4 weeks, then resume regular dose.   docusate sodium 100 MG capsule Commonly known as:  COLACE Take 1 capsule (100 mg total) by mouth 2 (two) times daily.   ferrous sulfate 325 (65 FE) MG tablet Take 1 tablet (325 mg total) by mouth 3 (three) times daily with meals.   HYDROcodone-acetaminophen 7.5-325 MG tablet Commonly known as:  NORCO Take 1-2 tablets by mouth every 4 (four) hours as needed for moderate pain.   HYDROcodone-acetaminophen 7.5-325 MG tablet Commonly known as:  NORCO Take 1-2 tablets by mouth every 4 (four) hours as needed for moderate pain. Start taking on:  12/23/2017   JUBLIA 10 % Soln Generic drug:  Efinaconazole Apply 1 application topically daily.   methocarbamol 500 MG tablet Commonly known as:   ROBAXIN Take 1 tablet (500 mg total) by mouth every 6 (six) hours as needed for muscle spasms.   MULTIVITAMIN PO Take 1 tablet by mouth daily.   polyethylene glycol packet Commonly known as:  MIRALAX / GLYCOLAX Take 17 g by mouth 2 (two) times daily.   psyllium 58.6 % packet Commonly known as:  METAMUCIL Take 1 packet by mouth daily.   sertraline 100 MG tablet Commonly known as:  ZOLOFT Take 150 mg by mouth daily.            Discharge Care Instructions  (From admission, onward)         Start     Ordered   12/19/17 0000  Change dressing    Comments:  Maintain surgical dressing until follow up in  the clinic. If the edges start to pull up, may reinforce with tape. If the dressing is no longer working, may remove and cover with gauze and tape, but must keep the area dry and clean.  Call with any questions or concerns.   12/19/17 1610           Signed: Anastasio Auerbach. Wendle Kina   PA-C  12/19/2017, 2:53 PM

## 2017-12-19 NOTE — Progress Notes (Signed)
Physical Therapy Treatment Patient Details Name: Rick Flores MRN: [id number] DOB: Jan 14, 1956 Today's Date: 12/19/2017    History of Present Illness 62 yo male s/p R DA-THA on 12/18/17. PMH includes L index finger distal phalanx amputation, L THA 2018.     PT Comments    Pt has met PT goals and is ready to DC home from PT standpoint. He ambulated 375' with RW, completed stair training, and demonstrates good understanding of HEP.   Follow Up Recommendations  Follow surgeon's recommendation for DC plan and follow-up therapies;Supervision for mobility/OOB;No PT follow up(HEP)     Equipment Recommendations  None recommended by PT(Pt has rollator, typically would recommend RW but pt reports only using rollator for 1 day with last hip replacement)    Recommendations for Other Services       Precautions / Restrictions Precautions Precautions: Fall Restrictions Weight Bearing Restrictions: No Other Position/Activity Restrictions: WBAT     Mobility  Bed Mobility               General bed mobility comments: up in recliner  Transfers Overall transfer level: Needs assistance Equipment used: Rolling walker (2 wheeled) Transfers: Sit to/from Stand Sit to Stand: Modified independent (Device/Increase time)            Ambulation/Gait Ambulation/Gait assistance: Modified independent (Device/Increase time) Gait Distance (Feet): 375 Feet Assistive device: Rolling walker (2 wheeled) Gait Pattern/deviations: Decreased stride length;Decreased weight shift to right;Decreased stance time - right;Antalgic;Step-through pattern Gait velocity: decr    General Gait Details: steady,no LOB   Stairs Stairs: Yes Stairs assistance: Min guard Stair Management: No rails;Backwards;With walker Number of Stairs: 2 General stair comments: VCs sequencing   Wheelchair Mobility    Modified Rankin (Stroke Patients Only)       Balance Overall balance assessment: Modified Independent                                          Cognition Arousal/Alertness: Awake/alert Behavior During Therapy: WFL for tasks assessed/performed Overall Cognitive Status: Within Functional Limits for tasks assessed                                        Exercises Total Joint Exercises Ankle Circles/Pumps: AROM;Both;10 reps;Supine Quad Sets: AROM;Both;5 reps;Supine Short Arc Quad: AROM;Right;10 reps;Supine Heel Slides: AROM;Right;10 reps;Supine Hip ABduction/ADduction: AROM;Right;10 reps;Supine;Standing(x 10 sitting and x10 standing) Long Arc Quad: AROM;Right;10 reps;Seated Knee Flexion: AROM;10 reps;Right;Standing Marching in Standing: AROM;Right;10 reps;Standing Standing Hip Extension: AROM;Right;10 reps;Standing    General Comments        Pertinent Vitals/Pain Pain Score: 3  Pain Location: R hip  Pain Descriptors / Indicators: Sore Pain Intervention(s): Limited activity within patient's tolerance;Monitored during session;Premedicated before session;Ice applied    Home Living                      Prior Function            PT Goals (current goals can now be found in the care plan section) Acute Rehab PT Goals Patient Stated Goal: mountain biking PT Goal Formulation: With patient/family Time For Goal Achievement: 01/01/18 Potential to Achieve Goals: Good Progress towards PT goals: Goals met/education completed, patient discharged from PT    Frequency    7X/week      PT Plan  Current plan remains appropriate    Co-evaluation              AM-PAC PT "6 Clicks" Daily Activity  Outcome Measure  Difficulty turning over in bed (including adjusting bedclothes, sheets and blankets)?: A Little Difficulty moving from lying on back to sitting on the side of the bed? : A Little Difficulty sitting down on and standing up from a chair with arms (e.g., wheelchair, bedside commode, etc,.)?: A Little Help needed moving to and from  a bed to chair (including a wheelchair)?: None Help needed walking in hospital room?: None Help needed climbing 3-5 steps with a railing? : A Little 6 Click Score: 20    End of Session Equipment Utilized During Treatment: Gait belt Activity Tolerance: Patient tolerated treatment well Patient left: with call bell/phone within reach;in chair;with family/visitor present Nurse Communication: Mobility status PT Visit Diagnosis: Other abnormalities of gait and mobility (R26.89);Difficulty in walking, not elsewhere classified (R26.2)     Time: 1050-1110 PT Time Calculation (min) (ACUTE ONLY): 20 min  Charges:  $Gait Training: 8-22 mins                     Blondell Reveal Kistler PT 12/19/2017  Acute Rehabilitation Services Pager [phone] Office [phone]

## 2017-12-19 NOTE — Progress Notes (Signed)
     Subjective: 1 Day Post-Op Procedure(s) (LRB): RIGHT TOTAL HIP ARTHROPLASTY ANTERIOR APPROACH (Right)   Patient reports pain as mild, pain controlled.  Pain a little more than last after last surgery, but still feels he is doing well.  No events throughout the night. Ready to be discharged home.    Objective:   VITALS:   Vitals:   12/19/17 0139 12/19/17 0515  BP: 119/79 131/86  Pulse: 91 84  Resp: 16 18  Temp: (!) 97.4 F (36.3 C) 98 F (36.7 C)  SpO2: 98% 98%    Dorsiflexion/Plantar flexion intact Incision: dressing C/D/I No cellulitis present Compartment soft  LABS Recent Labs    12/19/17 0529  HGB 13.4  HCT 38.7*  WBC 13.2*  PLT 181    Recent Labs    12/19/17 0529  NA 139  K 4.1  BUN 13  CREATININE 0.78  GLUCOSE 133*     Assessment/Plan: 1 Day Post-Op Procedure(s) (LRB): RIGHT TOTAL HIP ARTHROPLASTY ANTERIOR APPROACH (Right) Foley cath d/c'ed Advance diet Up with therapy D/C IV fluids Discharge home [facility] Follow up with OLIN,Almina Schul D in 2 weeks.  Contact information:  [facility]) [address] [place] [id number] [phone]    Overweight (BMI 25-29.9) Estimated body mass index is 27.12 kg/m as calculated from the following:   Height as of this encounter: 5\' 8"  (1.727 m).   Weight as of this encounter: 80.9 kg. Patient also counseled that weight may inhibit the healing process Patient counseled that losing weight will help with future health issues         Anastasio AuerbachMatthew S. Ebert Forrester   PAC  12/19/2017, 8:51 AM

## 2018-02-01 DIAGNOSIS — Z471 Aftercare following joint replacement surgery: Secondary | ICD-10-CM | POA: Diagnosis not present

## 2018-02-01 DIAGNOSIS — Z96641 Presence of right artificial hip joint: Secondary | ICD-10-CM | POA: Diagnosis not present

## 2018-11-04 IMAGING — DX DG HIP (WITH OR WITHOUT PELVIS) 1V PORT*L*
2 series · 2 of 2 positions shown · non-contrast
Comparison: 03/06/2016 .

CLINICAL DATA: Left hip replacement.

EXAM:
DG HIP (WITH OR WITHOUT PELVIS) 1V PORT LEFT

[pelvis ap]
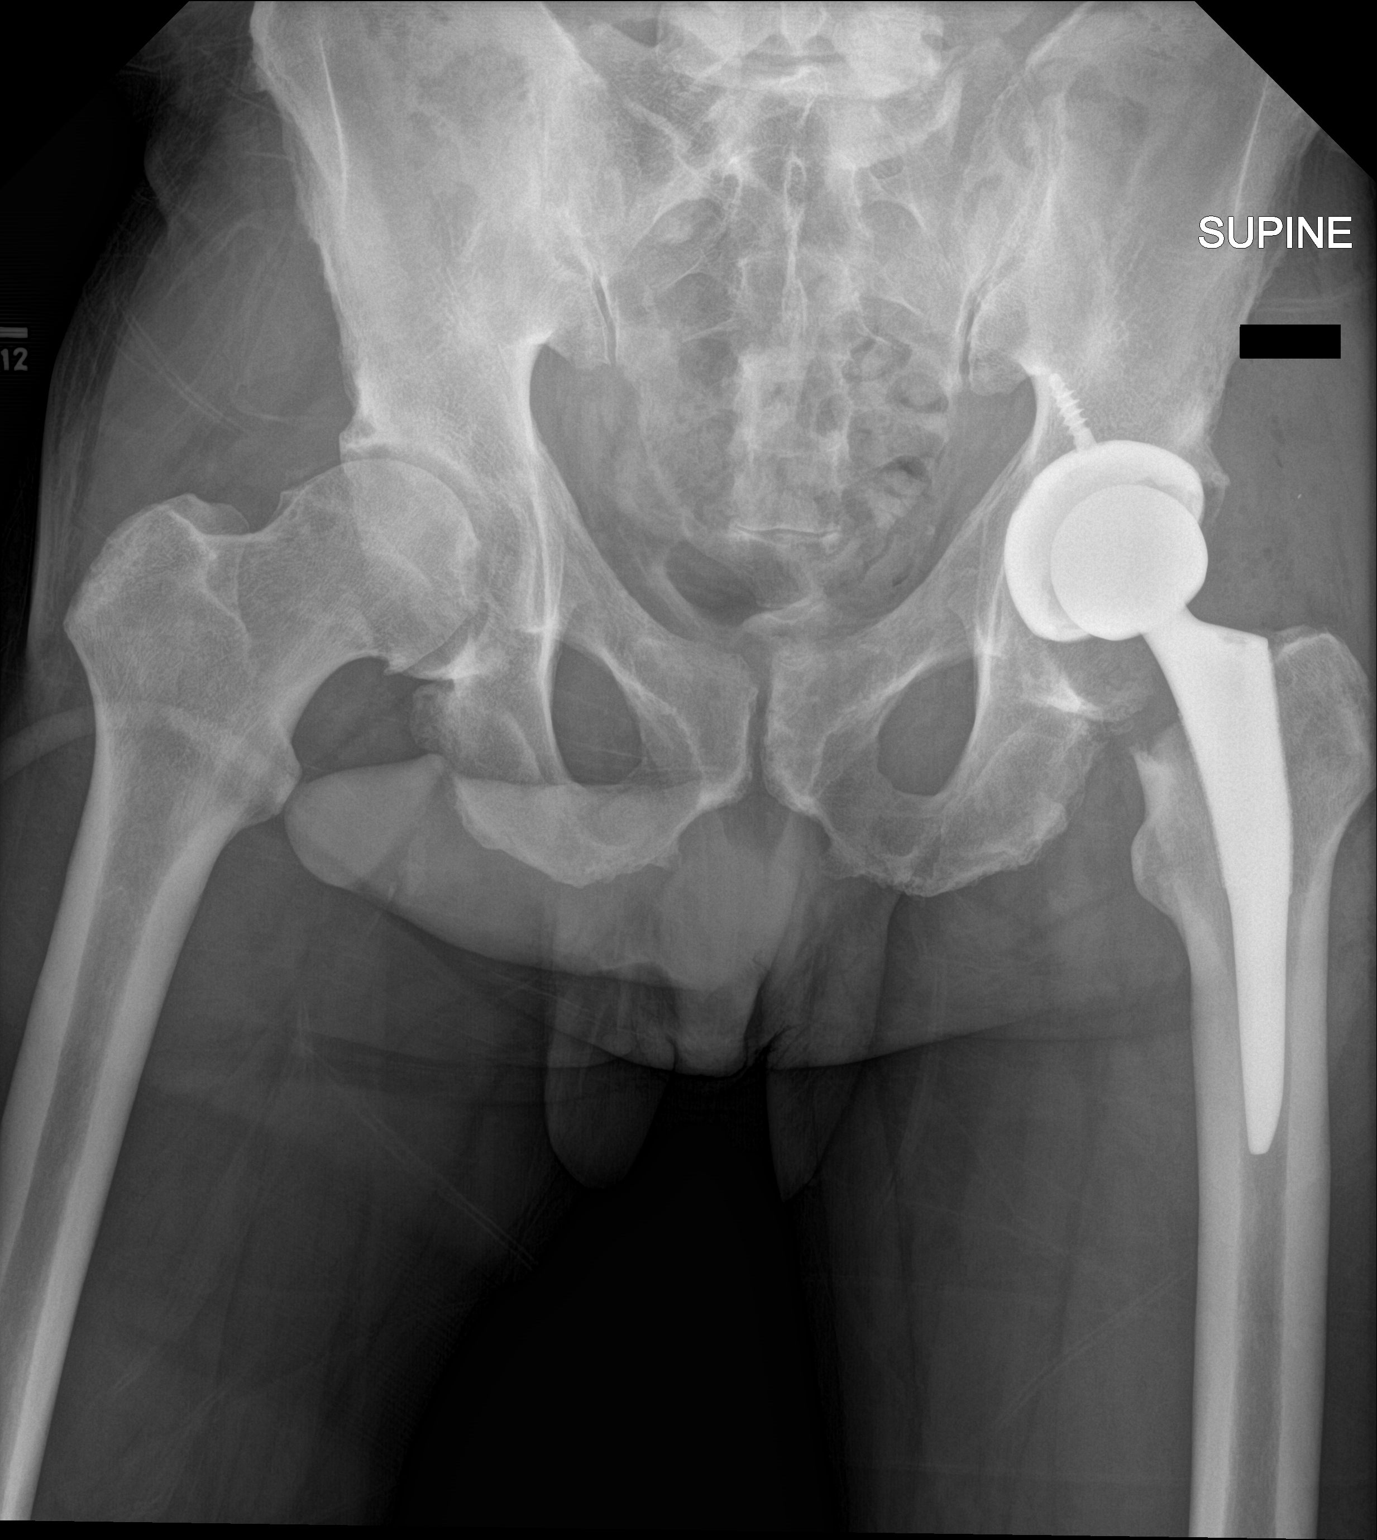

[hip ap]
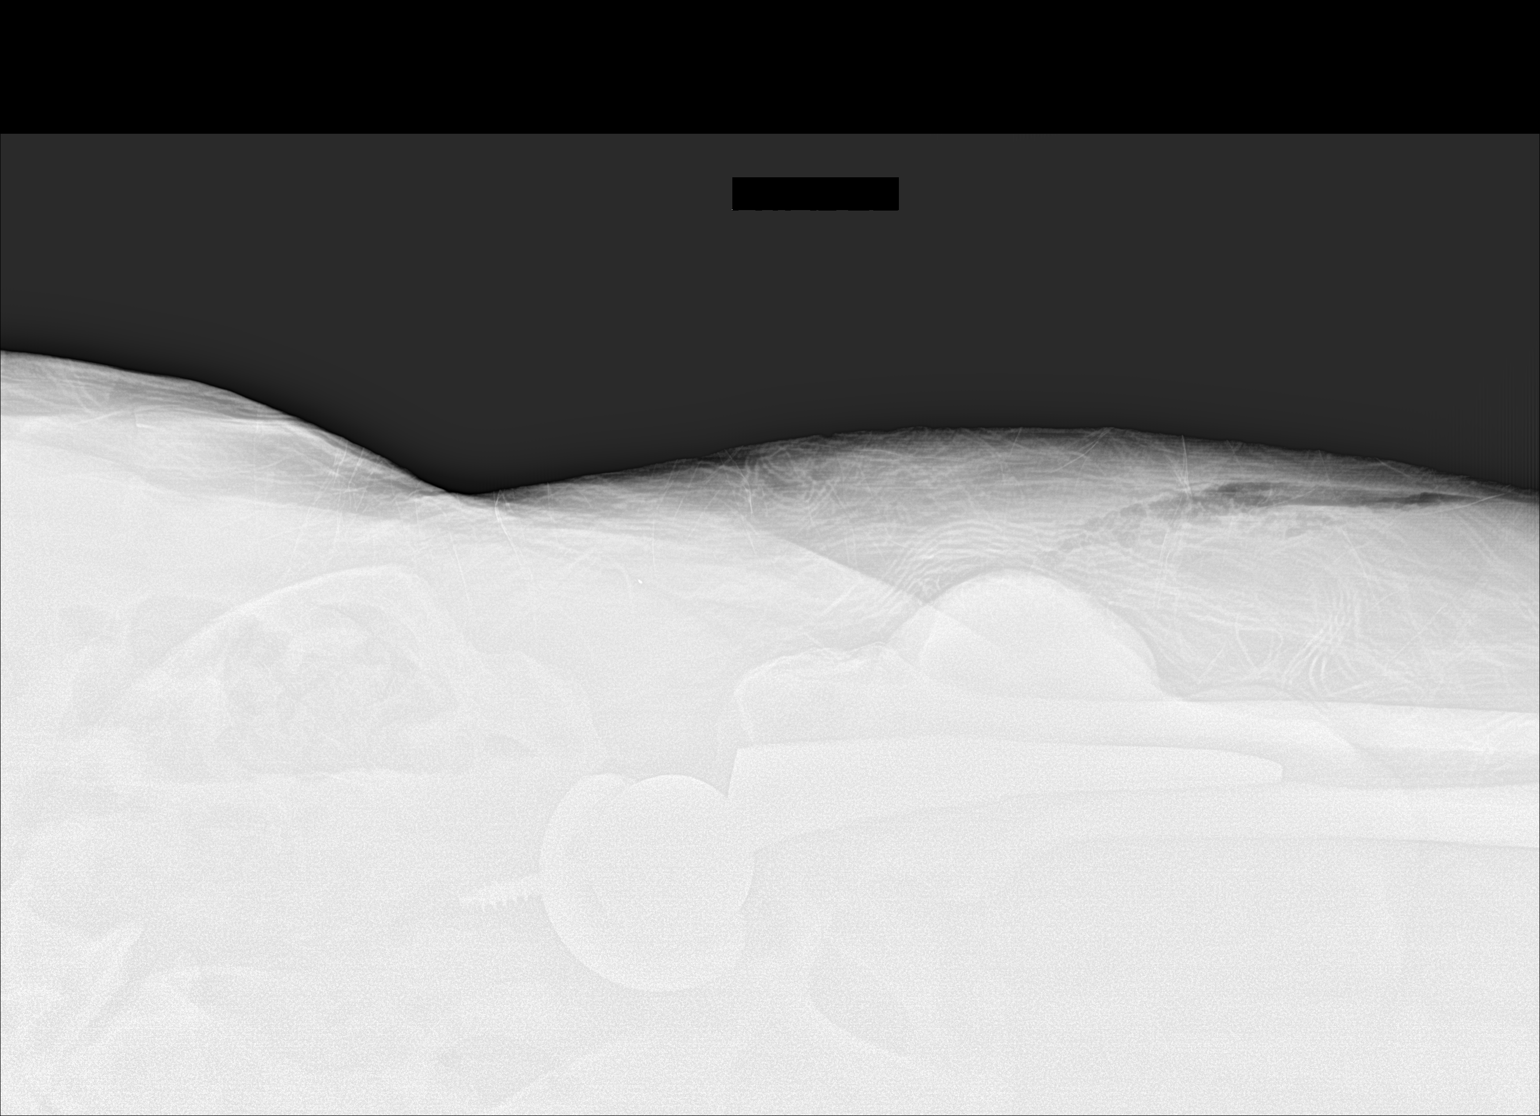

[2 of 2 positions shown; findings below may reference images not displayed]

FINDINGS: Total left hip replacement. Hardware intact. No acute bony
abnormality identified. Degenerative changes right hip.
IMPRESSION: Total left hip replacement with anatomic alignment. Hardware intact
.

## 2020-02-14 ENCOUNTER — Ambulatory Visit: Admit: 2020-02-14 | Payer: 59 | Admitting: Orthopedic Surgery

## 2020-02-14 SURGERY — REPAIR, TENDON, TRICEPS
Anesthesia: General | Laterality: Right

## 2020-02-26 NOTE — Pre-Procedure Instructions (Addendum)
[id number] [place], [place] - [address] [place] [address] Phone: [phone] Fax: [phone]      Your procedure is scheduled on Tuesday January 25th.  Report to [facility] Main Entrance "A" at 3:00 PM, and check in at the Admitting office.  Call this number if you have problems the morning of surgery:  [phone]  Call [phone] if you have any questions prior to your surgery date Monday-Friday 8am-4pm    Remember:  Do not eat after midnight the night before your surgery  You may drink clear liquids until 2:00 PM the day of your surgery.   Clear liquids allowed are: Water, Non-Citrus Juices (without pulp), Carbonated Beverages, Clear Tea, Black Coffee Only, and Gatorade Patient Instructions  . The night before surgery:  o No food after midnight. ONLY clear liquids after midnight   . The day of surgery (if you do NOT have diabetes):  o Drink ONE (1) Pre-Surgery Clear Ensure as directed.   o This drink was given to you during your hospital  pre-op appointment visit. o The pre-op nurse will instruct you on the time to drink the  Pre-Surgery Ensure depending on your surgery time. o Finish the drink by 2:00 PM the day of your surgery. o Nothing else to drink after completing the  Pre-Surgery Clear Ensure.          If you have questions, please contact your surgeon's office.     Take these medicines the morning of surgery with A SIP OF WATER  ARIPiprazole (ABILIFY)  sertraline (ZOLOFT)  Sofosbuvir-Velpatasvir  As of today, STOP taking any Aspirin (unless otherwise instructed by your surgeon) Aleve, Naproxen, Ibuprofen, Motrin, Advil, Goody's, BC's, all herbal medications, fish oil, and all vitamins.                      Do not wear jewelry, make up, or nail polish            Do not wear lotions, powders, perfumes/colognes, or deodorant.            Do not shave 48 hours prior to surgery.  Men may shave face and neck.             Do not bring valuables to the hospital.            [facility] is not responsible for any belongings or valuables.  Do NOT Smoke (Tobacco/Vaping) or drink Alcohol 24 hours prior to your procedure If you use a CPAP at night, you may bring all equipment for your overnight stay.   Contacts, glasses, dentures or bridgework may not be worn into surgery.      For patients admitted to the hospital, discharge time will be determined by your treatment team.   Patients discharged the day of surgery will not be allowed to drive home, and someone needs to stay with them for 24 hours.    Special instructions:    [facility]- Preparing for Shoulder Surgery  ?  Before surgery, you can play an important role. Because skin is not sterile, your skin needs to be as free of germs as possible. You can reduce the number of germs on your skin by using the following products.   1). Benzoyl Peroxide Gel: reduces the number of germs present on the skin   *Applied twice a day to shoulder area starting two days before surgery     2). Chlorhexidine Gluconate (CHG)  Soap: An antiseptic cleaner that kills germs and bonds with the skin to continue killing germs even after washing   *Used for showering the night before surgery and morning of surgery   ?    Please follow these instructions carefully:     1). BENZOYL PEROXIDE 5% GEL (Please do not use if you have an allergy to benzoyl peroxide.   If your skin becomes reddened/irritated stop using the benzoyl peroxide)     Starting TWO DAYS BEFORE surgery:    Apply benzoyl peroxide in the morning and at night. Apply after taking a shower. If you are not taking a shower clean entire shoulder front, back, and side along with the armpit with a clean wet washcloth.     Place a quarter-sized amount of gel on your shoulder and rub in thoroughly, making sure to cover the front, back, and side of your shoulder, along with the armpit.                            Do this twice a day for two days.  (Last application is the night before surgery, AFTER using the CHG soap as described below).   Do NOT apply benzoyl peroxide gel on the day of surgery.   2 days before ____ AM   ____ PM              1 day before ____ AM   ____ PM    2) CHG Soap: Please do not use if you have an allergy to CHG or antibacterial soaps. If your skin becomes reddened/irritated stop using the CHG.  Do not shave (including legs and underarms) for at least 48 hours prior to first CHG shower. It is OK to shave your face.  Please follow these instructions carefully.   1. Shower the NIGHT BEFORE SURGERY (before applying benzoyl peroxide gel) and the MORNING OF SURGERY with CHG Soap.   2. If you chose to wash your hair, wash your hair first as usual with your normal shampoo.  3. After you shampoo, rinse your hair and body thoroughly to remove the shampoo.  4. Use CHG as you would any other liquid soap. You can apply CHG directly to the skin and wash gently with a scrungie or a clean washcloth.   5. Apply the CHG Soap to your body ONLY FROM THE NECK DOWN.  Do not use on open wounds or open sores. Avoid contact with your eyes, ears, mouth and genitals (private parts). Wash Face and genitals (private parts) with your normal soap.   6. Wash thoroughly, paying special attention to the area where your surgery will be performed.  7. Thoroughly rinse your body with warm water from the neck down.  8. DO NOT shower/wash with your normal soap after using and rinsing off the CHG Soap.  9. Pat yourself dry with a CLEAN TOWEL.  10. Wear CLEAN PAJAMAS to bed the night before surgery  11. Place CLEAN SHEETS on your bed the night of your first shower and DO NOT SLEEP WITH PETS.  Oral Hygiene is also important to reduce your risk of infection.  Remember - BRUSH YOUR TEETH THE MORNING OF SURGERY WITH YOUR REGULAR TOOTHPASTE  Day of Surgery: Wear Clean/Comfortable clothing the morning of  surgery Do not apply any deodorants/lotions.   Remember to brush your teeth WITH YOUR REGULAR TOOTHPASTE.     Please read over the following fact sheets that you  were given.

## 2020-02-27 ENCOUNTER — Encounter (HOSPITAL_COMMUNITY)
Admission: RE | Admit: 2020-02-27 | Discharge: 2020-02-27 | Disposition: A | Discharge status: Home / Self Care | Payer: 59 | Source: Ambulatory Visit | Attending: Orthopedic Surgery | Admitting: Orthopedic Surgery

## 2020-02-27 ENCOUNTER — Other Ambulatory Visit (HOSPITAL_COMMUNITY): Payer: 59

## 2020-02-27 ENCOUNTER — Other Ambulatory Visit: Payer: Self-pay

## 2020-02-27 ENCOUNTER — Encounter (HOSPITAL_COMMUNITY): Payer: Self-pay

## 2020-02-27 DIAGNOSIS — Z01812 Encounter for preprocedural laboratory examination: Secondary | ICD-10-CM | POA: Diagnosis present

## 2020-02-27 HISTORY — DX: Inflammatory liver disease, unspecified: K75.9

## 2020-02-27 LAB — COMPREHENSIVE METABOLIC PANEL
ALT: 21 U/L (ref 0–44)
AST: 26 U/L (ref 15–41)
Albumin: 3.8 g/dL (ref 3.5–5.0)
Alkaline Phosphatase: 51 U/L (ref 38–126)
Anion gap: 10 (ref 5–15)
BUN: 14 mg/dL (ref 8–23)
CO2: 26 mmol/L (ref 22–32)
Calcium: 9.2 mg/dL (ref 8.9–10.3)
Chloride: 102 mmol/L (ref 98–111)
Creatinine, Ser: 1.03 mg/dL (ref 0.61–1.24)
GFR, Estimated: >60 mL/min (ref >60)
Glucose, Bld: 104 mg/dL — ABNORMAL HIGH (ref 70–99)
Potassium: 3.8 mmol/L (ref 3.5–5.1)
Sodium: 138 mmol/L (ref 135–145)
Total Bilirubin: 0.6 mg/dL (ref 0.3–1.2)
Total Protein: 7.1 g/dL (ref 6.5–8.1)

## 2020-02-27 LAB — CBC
HCT: 43.6 % (ref 39.0–52.0)
Hemoglobin: 15.1 g/dL (ref 13.0–17.0)
MCH: 33.6 pg (ref 26.0–34.0)
MCHC: 34.6 g/dL (ref 30.0–36.0)
MCV: 96.9 fL (ref 80.0–100.0)
Platelets: 197 10*3/uL (ref 150–400)
RBC: 4.5 MIL/uL (ref 4.22–5.81)
RDW: 11.8 % (ref 11.5–15.5)
WBC: 5.2 10*3/uL (ref 4.0–10.5)
nRBC: 0 % (ref 0.0–0.2)

## 2020-02-27 NOTE — Pre-Procedure Instructions (Addendum)
[id number] [place], [place] - [address] [place] [address] Phone: [phone] Fax: [phone]      Your procedure is scheduled on Tuesday March 02, 2020 from 5:00 PM - 6:30 PM.  Report to [facility] Main Entrance "A" at 3:00 PM, and check in at the Admitting office.  Call this number if you have problems the morning of surgery:  [phone]  Call [phone] if you have any questions prior to your surgery date Monday-Friday 8am-4pm    Remember:  Do not eat after midnight the night before your surgery  You may drink clear liquids until 2:00 PM the day of your surgery.   Clear liquids allowed are: Water, Non-Citrus Juices (without pulp), Carbonated Beverages, Clear Tea, Black Coffee Only, and Gatorade Patient Instructions  . The night before surgery:  o No food after midnight. ONLY clear liquids after midnight   . The day of surgery (if you do NOT have diabetes):  o Drink ONE (1) Pre-Surgery Clear Ensure as directed.   o This drink was given to you during your hospital  pre-op appointment visit. o The pre-op nurse will instruct you on the time to drink the  Pre-Surgery Ensure depending on your surgery time. o Finish the drink by 2:00 PM the day of your surgery. o Nothing else to drink after completing the  Pre-Surgery Clear Ensure.          If you have questions, please contact your surgeon's office.     Take these medicines the morning of surgery with A SIP OF WATER  ARIPiprazole (ABILIFY)  sertraline (ZOLOFT)  Sofosbuvir-Velpatasvir  As of today, STOP taking any Aspirin (unless otherwise instructed by your surgeon) Aleve, Naproxen, Ibuprofen, Motrin, Advil, Goody's, BC's, all herbal medications, fish oil, and all vitamins.                      Do not wear jewelry, make up, or nail polish            Do not wear lotions, powders, perfumes/colognes, or deodorant.            Do not shave 48 hours prior to surgery.  Men may  shave face and neck.            Do not bring valuables to the hospital.            [facility] is not responsible for any belongings or valuables.  Do NOT Smoke (Tobacco/Vaping) or drink Alcohol 24 hours prior to your procedure If you use a CPAP at night, you may bring all equipment for your overnight stay.   Contacts, glasses, dentures or bridgework may not be worn into surgery.      For patients admitted to the hospital, discharge time will be determined by your treatment team.   Patients discharged the day of surgery will not be allowed to drive home, and someone needs to stay with them for 24 hours.   Please follow these instructions carefully.   1. Shower the NIGHT BEFORE SURGERY (before applying benzoyl peroxide gel) and the MORNING OF SURGERY with CHG Soap.   2. If you chose to wash your hair, wash your hair first as usual with your normal shampoo.  3. After you shampoo, rinse your hair and body thoroughly to remove the shampoo.  4. Use CHG as you would any other liquid soap. You can apply CHG directly to the skin and wash gently with  a scrungie or a clean washcloth.   5. Apply the CHG Soap to your body ONLY FROM THE NECK DOWN.  Do not use on open wounds or open sores. Avoid contact with your eyes, ears, mouth and genitals (private parts). Wash Face and genitals (private parts) with your normal soap.   6. Wash thoroughly, paying special attention to the area where your surgery will be performed.  7. Thoroughly rinse your body with warm water from the neck down.  8. DO NOT shower/wash with your normal soap after using and rinsing off the CHG Soap.  9. Pat yourself dry with a CLEAN TOWEL.  10. Wear CLEAN PAJAMAS to bed the night before surgery  11. Place CLEAN SHEETS on your bed the night of your first shower and DO NOT SLEEP WITH PETS.  Oral Hygiene is also important to reduce your risk of infection.  Remember - BRUSH YOUR TEETH THE MORNING OF SURGERY WITH YOUR REGULAR  TOOTHPASTE  Day of Surgery: Wear Clean/Comfortable clothing the morning of surgery Do not apply any deodorants/lotions.   Remember to brush your teeth WITH YOUR REGULAR TOOTHPASTE.     Please read over the following fact sheets that you were given.

## 2020-02-27 NOTE — Progress Notes (Signed)
PCP - Dr. Mila Palmer Cardiologist - Denies  PPM/ICD - Denies  Chest x-ray - N/A EKG - N/A Stress Test - Denies ECHO - Denies Cardiac Cath - Denies  Sleep Study - Denies  Patient denies having diabetes.  Blood Thinner Instructions: N/A Aspirin Instructions: N/A  ERAS Protcol - Yes PRE-SURGERY Ensure - Yes  COVID TEST- No; Positive test on 02/17/20   Anesthesia review: No   Coronavirus Screening  Have you experienced the following symptoms:  Cough yes/no: No Fever (>100.38F)  yes/no: No Runny nose yes/no: No Sore throat yes/no: No Difficulty breathing/shortness of breath  yes/no: No  Have you or a family member traveled in the last 14 days and where? yes/no: No   If the patient indicates "YES" to the above questions, their PAT will be rescheduled to limit the exposure to others and, the surgeon will be notified. THE PATIENT WILL NEED TO BE ASYMPTOMATIC FOR 14 DAYS.   If the patient is not experiencing any of these symptoms, the PAT nurse will instruct them to NOT bring anyone with them to their appointment since they may have these symptoms or traveled as well.   Please remind your patients and families that hospital visitation restrictions are in effect and the importance of the restrictions.    Patient denies shortness of breath, fever, cough and chest pain at PAT appointment   All instructions explained to the patient, with a verbal understanding of the material. Patient agrees to go over the instructions while at home for a better understanding. Patient also instructed to self quarantine after being tested for COVID-19. The opportunity to ask questions was provided.

## 2020-03-02 ENCOUNTER — Other Ambulatory Visit: Payer: Self-pay

## 2020-03-02 ENCOUNTER — Encounter (HOSPITAL_COMMUNITY): Payer: Self-pay | Admitting: Orthopedic Surgery

## 2020-03-02 ENCOUNTER — Ambulatory Visit (HOSPITAL_COMMUNITY)
Admission: RE | Admit: 2020-03-02 | Discharge: 2020-03-02 | Disposition: A | Discharge status: Home / Self Care | Payer: 59 | Attending: Orthopedic Surgery | Admitting: Orthopedic Surgery

## 2020-03-02 ENCOUNTER — Encounter (HOSPITAL_COMMUNITY)
Admission: RE | Disposition: A | Discharge status: Home / Self Care | Payer: Self-pay | Source: Home / Self Care | Attending: Orthopedic Surgery

## 2020-03-02 ENCOUNTER — Ambulatory Visit (HOSPITAL_COMMUNITY): Payer: 59 | Admitting: Vascular Surgery

## 2020-03-02 ENCOUNTER — Ambulatory Visit (HOSPITAL_COMMUNITY): Payer: 59 | Admitting: Anesthesiology

## 2020-03-02 DIAGNOSIS — Z79899 Other long term (current) drug therapy: Secondary | ICD-10-CM | POA: Diagnosis not present

## 2020-03-02 DIAGNOSIS — S46311A Strain of muscle, fascia and tendon of triceps, right arm, initial encounter: Secondary | ICD-10-CM | POA: Diagnosis not present

## 2020-03-02 DIAGNOSIS — Z9841 Cataract extraction status, right eye: Secondary | ICD-10-CM | POA: Diagnosis not present

## 2020-03-02 DIAGNOSIS — M7031 Other bursitis of elbow, right elbow: Secondary | ICD-10-CM | POA: Insufficient documentation

## 2020-03-02 DIAGNOSIS — Z87891 Personal history of nicotine dependence: Secondary | ICD-10-CM | POA: Insufficient documentation

## 2020-03-02 DIAGNOSIS — Z974 Presence of external hearing-aid: Secondary | ICD-10-CM | POA: Insufficient documentation

## 2020-03-02 DIAGNOSIS — Z961 Presence of intraocular lens: Secondary | ICD-10-CM | POA: Diagnosis not present

## 2020-03-02 DIAGNOSIS — X58XXXA Exposure to other specified factors, initial encounter: Secondary | ICD-10-CM | POA: Diagnosis not present

## 2020-03-02 DIAGNOSIS — Z96643 Presence of artificial hip joint, bilateral: Secondary | ICD-10-CM | POA: Diagnosis not present

## 2020-03-02 HISTORY — PX: TRICEPS TENDON REPAIR: SHX2577

## 2020-03-02 HISTORY — DX: Unspecified hearing loss, unspecified ear: H91.90

## 2020-03-02 SURGERY — REPAIR, TENDON, TRICEPS
Anesthesia: General | Laterality: Right

## 2020-03-02 MED ORDER — KETOROLAC TROMETHAMINE 30 MG/ML IJ SOLN
INTRAMUSCULAR | Status: AC
  Filled 2020-03-02: qty 1

## 2020-03-02 MED ORDER — PROPOFOL 10 MG/ML IV BOLUS
INTRAVENOUS | Status: DC | PRN
Start: 2020-03-02 — End: 2020-03-02
  Administered 2020-03-02 (×2): 200 mg via INTRAVENOUS

## 2020-03-02 MED ORDER — PROMETHAZINE HCL 25 MG/ML IJ SOLN: 6.2500 mg | INTRAMUSCULAR | Status: DC | PRN

## 2020-03-02 MED ORDER — LIDOCAINE HCL (CARDIAC) PF 100 MG/5ML IV SOSY
PREFILLED_SYRINGE | INTRAVENOUS | Status: DC | PRN
  Administered 2020-03-02: 60 mg via INTRAVENOUS

## 2020-03-02 MED ORDER — FENTANYL CITRATE (PF) 250 MCG/5ML IJ SOLN
INTRAMUSCULAR | Status: AC
  Filled 2020-03-02: qty 5

## 2020-03-02 MED ORDER — BUPIVACAINE-EPINEPHRINE (PF) 0.25% -1:200000 IJ SOLN
INTRAMUSCULAR | Status: AC
  Filled 2020-03-02: qty 30

## 2020-03-02 MED ORDER — DEXAMETHASONE SODIUM PHOSPHATE 10 MG/ML IJ SOLN
INTRAMUSCULAR | Status: DC | PRN
  Administered 2020-03-02: 10 mg via INTRAVENOUS

## 2020-03-02 MED ORDER — LACTATED RINGERS IV SOLN: INTRAVENOUS | Status: DC

## 2020-03-02 MED ORDER — SODIUM CHLORIDE 0.9 % IR SOLN
Status: DC | PRN
  Administered 2020-03-02: 1000 mL

## 2020-03-02 MED ORDER — ONDANSETRON HCL 4 MG/2ML IJ SOLN
INTRAMUSCULAR | Status: DC | PRN
  Administered 2020-03-02: 4 mg via INTRAVENOUS

## 2020-03-02 MED ORDER — PHENYLEPHRINE HCL-NACL 10-0.9 MG/250ML-% IV SOLN
INTRAVENOUS | Status: DC | PRN
  Administered 2020-03-02: 20 ug/min via INTRAVENOUS

## 2020-03-02 MED ORDER — FENTANYL CITRATE (PF) 250 MCG/5ML IJ SOLN
INTRAMUSCULAR | Status: DC | PRN
  Administered 2020-03-02 (×2): 25 ug via INTRAVENOUS
  Administered 2020-03-02: 50 ug via INTRAVENOUS

## 2020-03-02 MED ORDER — OXYCODONE HCL 5 MG/5ML PO SOLN: 5.0000 mg | Freq: Once | ORAL | Status: DC | PRN

## 2020-03-02 MED ORDER — FENTANYL CITRATE (PF) 100 MCG/2ML IJ SOLN
INTRAMUSCULAR | Status: AC
  Administered 2020-03-02: 50 ug via INTRAVENOUS
  Filled 2020-03-02: qty 2

## 2020-03-02 MED ORDER — ACETAMINOPHEN 10 MG/ML IV SOLN: 1000.0000 mg | Freq: Once | INTRAVENOUS | Status: DC | PRN

## 2020-03-02 MED ORDER — MIDAZOLAM HCL 2 MG/2ML IJ SOLN
INTRAMUSCULAR | Status: DC | PRN
  Administered 2020-03-02: 2 mg via INTRAVENOUS

## 2020-03-02 MED ORDER — KETOROLAC TROMETHAMINE 30 MG/ML IJ SOLN
30.0000 mg | Freq: Once | INTRAMUSCULAR | Status: AC
  Administered 2020-03-02: 30 mg via INTRAVENOUS

## 2020-03-02 MED ORDER — ORAL CARE MOUTH RINSE: 15.0000 mL | Freq: Once | OROMUCOSAL | Status: AC

## 2020-03-02 MED ORDER — CEFAZOLIN SODIUM-DEXTROSE 2-4 GM/100ML-% IV SOLN
2.0000 g | INTRAVENOUS | Status: AC
  Administered 2020-03-02: 2 g via INTRAVENOUS
  Filled 2020-03-02: qty 100

## 2020-03-02 MED ORDER — PROPOFOL 10 MG/ML IV BOLUS
INTRAVENOUS | Status: AC
  Filled 2020-03-02: qty 40

## 2020-03-02 MED ORDER — OXYCODONE HCL 5 MG PO TABS: 5.0000 mg | ORAL_TABLET | Freq: Once | ORAL | Status: DC | PRN

## 2020-03-02 MED ORDER — EPHEDRINE SULFATE-NACL 50-0.9 MG/10ML-% IV SOSY
PREFILLED_SYRINGE | INTRAVENOUS | Status: DC | PRN
  Administered 2020-03-02: 10 mg via INTRAVENOUS
  Administered 2020-03-02: 5 mg via INTRAVENOUS

## 2020-03-02 MED ORDER — CHLORHEXIDINE GLUCONATE 0.12 % MT SOLN
15.0000 mL | Freq: Once | OROMUCOSAL | Status: AC
  Administered 2020-03-02: 15 mL via OROMUCOSAL
  Filled 2020-03-02: qty 15

## 2020-03-02 MED ORDER — MIDAZOLAM HCL 2 MG/2ML IJ SOLN
INTRAMUSCULAR | Status: AC
  Filled 2020-03-02: qty 2

## 2020-03-02 MED ORDER — FENTANYL CITRATE (PF) 100 MCG/2ML IJ SOLN
25.0000 ug | INTRAMUSCULAR | Status: DC | PRN
  Administered 2020-03-02: 50 ug via INTRAVENOUS

## 2020-03-02 MED ORDER — BUPIVACAINE-EPINEPHRINE (PF) 0.25% -1:200000 IJ SOLN
INTRAMUSCULAR | Status: DC | PRN
  Administered 2020-03-02: 10 mL via PERINEURAL

## 2020-03-02 SURGICAL SUPPLY — 57 items
BNDG ELASTIC 3X5.8 VLCR STR LF (GAUZE/BANDAGES/DRESSINGS) IMPLANT
BNDG ELASTIC 4X5.8 VLCR STR LF (GAUZE/BANDAGES/DRESSINGS) ×2 IMPLANT
BNDG GAUZE ELAST 4 BULKY (GAUZE/BANDAGES/DRESSINGS) ×3 IMPLANT
CORD BIPOLAR FORCEPS 12FT (ELECTRODE) ×2 IMPLANT
COVER SURGICAL LIGHT HANDLE (MISCELLANEOUS) ×2 IMPLANT
COVER WAND RF STERILE (DRAPES) ×1 IMPLANT
CUFF TOURN SGL QUICK 18X4 (TOURNIQUET CUFF) ×2 IMPLANT
CUFF TOURN SGL QUICK 24 (TOURNIQUET CUFF)
CUFF TRNQT CYL 24X4X16.5-23 (TOURNIQUET CUFF) IMPLANT
DECANTER SPIKE VIAL GLASS SM (MISCELLANEOUS) ×1 IMPLANT
DRAPE INCISE IOBAN 66X45 STRL (DRAPES) ×1 IMPLANT
DRAPE OEC MINIVIEW 54X84 (DRAPES) IMPLANT
DRAPE SURG 17X23 STRL (DRAPES) ×2 IMPLANT
DRSG ADAPTIC 3X8 NADH LF (GAUZE/BANDAGES/DRESSINGS) ×1 IMPLANT
GAUZE SPONGE 4X4 12PLY STRL (GAUZE/BANDAGES/DRESSINGS) ×1 IMPLANT
GAUZE XEROFORM 1X8 LF (GAUZE/BANDAGES/DRESSINGS) IMPLANT
GAUZE XEROFORM 5X9 LF (GAUZE/BANDAGES/DRESSINGS) ×1 IMPLANT
GLOVE BIOGEL M 8.0 STRL (GLOVE) ×2 IMPLANT
GLOVE SS BIOGEL STRL SZ 8 (GLOVE) ×1 IMPLANT
GLOVE SUPERSENSE BIOGEL SZ 8 (GLOVE) ×1
GOWN STRL REUS W/ TWL LRG LVL3 (GOWN DISPOSABLE) ×2 IMPLANT
GOWN STRL REUS W/ TWL XL LVL3 (GOWN DISPOSABLE) ×3 IMPLANT
GOWN STRL REUS W/TWL LRG LVL3 (GOWN DISPOSABLE) ×4
GOWN STRL REUS W/TWL XL LVL3 (GOWN DISPOSABLE) ×6
IMP SYS 2ND FIX PEEK 4.75X19.1 (Miscellaneous) ×2 IMPLANT
IMPL SYS 2ND FX PEEK 4.75X19.1 (Miscellaneous) IMPLANT
KIT BASIN OR (CUSTOM PROCEDURE TRAY) ×2 IMPLANT
KIT TURNOVER KIT B (KITS) ×2 IMPLANT
MANIFOLD NEPTUNE II (INSTRUMENTS) ×2 IMPLANT
NDL HYPO 25GX1X1/2 BEV (NEEDLE) IMPLANT
NDL KEITH (NEEDLE) ×1 IMPLANT
NDL TAPERED W/ NITINOL LOOP (MISCELLANEOUS) IMPLANT
NEEDLE HYPO 25GX1X1/2 BEV (NEEDLE) IMPLANT
NEEDLE KEITH (NEEDLE) ×2 IMPLANT
NEEDLE TAPERED W/ NITINOL LOOP (MISCELLANEOUS) ×2 IMPLANT
NS IRRIG 1000ML POUR BTL (IV SOLUTION) ×2 IMPLANT
PACK ORTHO EXTREMITY (CUSTOM PROCEDURE TRAY) ×2 IMPLANT
PAD ARMBOARD 7.5X6 YLW CONV (MISCELLANEOUS) ×4 IMPLANT
PAD CAST 4YDX4 CTTN HI CHSV (CAST SUPPLIES) IMPLANT
PADDING CAST COTTON 4X4 STRL (CAST SUPPLIES)
PASSER SUT SWANSON 36MM LOOP (INSTRUMENTS) IMPLANT
SOL PREP POV-IOD 4OZ 10% (MISCELLANEOUS) ×6 IMPLANT
SUT FIBERWIRE #2 38 T-5 BLUE (SUTURE)
SUT PROLENE 3 0 PS 2 (SUTURE) ×2 IMPLANT
SUT PROLENE 3 0 SH DA (SUTURE) ×1 IMPLANT
SUT PROLENE 4 0 PS 2 18 (SUTURE) IMPLANT
SUT VIC AB 2-0 CT1 27 (SUTURE)
SUT VIC AB 2-0 CT1 TAPERPNT 27 (SUTURE) IMPLANT
SUTURE FIBERWR #2 38 T-5 BLUE (SUTURE) IMPLANT
SUTURE TAPE 1.3 40 TPR END (SUTURE) IMPLANT
SUTURETAPE 1.3 40 TPR END (SUTURE) ×2
SYR CONTROL 10ML LL (SYRINGE) ×1 IMPLANT
TOWEL GREEN STERILE (TOWEL DISPOSABLE) ×2 IMPLANT
TOWEL GREEN STERILE FF (TOWEL DISPOSABLE) ×2 IMPLANT
TUBE CONNECTING 12X1/4 (SUCTIONS) ×1 IMPLANT
UNDERPAD 30X36 HEAVY ABSORB (UNDERPADS AND DIAPERS) ×2 IMPLANT
WATER STERILE IRR 1000ML POUR (IV SOLUTION) ×2 IMPLANT

## 2020-03-02 NOTE — Discharge Instructions (Signed)
Please elevate and move your fingers frequently to help with the swelling.  Please keep the bandage clean and dry do not remove.  Please call for any problems. Although the bandage is a bit cumbersome it is in place to protect the repairs thus try your Habermann to endure the uncomfortable position of your arm being straight for the first 2 weeks. Keep bandage clean and dry.  Call for any problems.  No smoking.  Criteria for driving a car: you should be off your pain medicine for 7-8 hours, able to drive one handed(confident), thinking clearly and feeling able in your judgement to drive. Continue elevation as it will decrease swelling.  If instructed by MD move your fingers within the confines of the bandage/splint.  Use ice if instructed by your MD. Call immediately for any sudden loss of feeling in your hand/arm or change in functional abilities of the extremity.We recommend that you to take vitamin C 1000 mg a day to promote healing. We also recommend that if you require  pain medicine that you take a stool softener to prevent constipation as most pain medicines will have constipation side effects. We recommend either Peri-Colace or Senokot and recommend that you also consider adding MiraLAX as well to prevent the constipation affects from pain medicine if you are required to use them. These medicines are over the counter and may be purchased at a local pharmacy. A cup of yogurt and a probiotic can also be helpful during the recovery process as the medicines can disrupt your intestinal environment.

## 2020-03-02 NOTE — H&P (Signed)
Rick Flores is an 65 y.o. male.   Chief Complaint: Right triceps rupture HPI: Right elbow triceps rupture with bursitis and pain  Patient presents for evaluation and treatment of the of their upper extremity predicament. The patient denies neck, back, chest or  abdominal pain. The patient notes that they have no lower extremity problems. The patients primary complaint is noted. We are planning surgical care pathway for the upper extremity.  Past Medical History:  Diagnosis Date  . Arthritis   . Depression   . Hearing loss    wears bilateral hearing aids  . Hepatitis    Hep C    Past Surgical History:  Procedure Laterality Date  . 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 CATARACT EXTRACTION W/ INTRAOCULAR LENS IMPLANT Right   . colonscopy  7 years ago  . EYE SURGERY    . JOINT REPLACEMENT    . 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 TOTAL HIP ARTHROPLASTY Right 12/18/2017   Procedure: RIGHT TOTAL HIP ARTHROPLASTY ANTERIOR APPROACH;  Surgeon: Durene Romans, MD;  Location: [facility];  Service: Orthopedics;  Laterality: Right;  70 mins    History reviewed. No pertinent family history. Social History:  reports that he quit smoking about 36 years ago. His smoking use included cigarettes. He quit after 5.00 years of use. He has never used smokeless tobacco. He reports current alcohol use. He reports that he does not use drugs.  Allergies: No Known Allergies  Medications Prior to Admission  Medication Sig Dispense Refill  . ARIPiprazole (ABILIFY) 2 MG tablet Take 2 mg by mouth daily.  12  . ibuprofen (ADVIL) 200 MG tablet Take 400-600 mg by mouth every 6 (six) hours as needed for mild pain or moderate pain (Joint pain).    . Multiple Vitamins-Minerals (MULTIVITAMIN PO) Take 1 tablet by mouth daily.    . psyllium (METAMUCIL)  58.6 % packet Take 1 packet by mouth daily.    . sertraline (ZOLOFT) 100 MG tablet Take 150 mg by mouth daily.  4  . sildenafil (VIAGRA) 100 MG tablet Take 100 mg by mouth daily as needed for erectile dysfunction.    . Sofosbuvir-Velpatasvir 400-100 MG TABS Take 1 tablet by mouth daily.      No results found for this or any previous visit (from the past 48 hour(s)). No results found.  Review of Systems  Respiratory: Negative.   Cardiovascular: Negative.   Gastrointestinal: Negative.   Genitourinary: Negative.     Blood pressure 140/85, pulse 86, temperature 97.6 F (36.4 C), temperature source Oral, resp. rate 18, height 5\' 8"  (1.727 m), weight 80.3 kg, SpO2 95 %. Physical Exam  Patient has right distal triceps rupture with retraction.  He has loss of power strength and pain as well as bursitis.  I discussed him these issues at length and the findings.  The patient is alert and oriented in no acute distress. The patient complains of pain in the affected upper extremity.  The patient is noted to have a normal HEENT exam. Lung fields show equal chest expansion and no shortness of breath. Abdomen exam is nontender without distention. Lower extremity examination does not show any fracture dislocation or blood clot symptoms. Pelvis is stable and the neck and back are stable and nontender. Assessment/Plan We will plan for attempted reconstruction right  elbow triceps repair reconstruction with ulnar nerve release and bursectomy.  Patient and I discussed the challenges in regards to the repair reconstruction efforts and other issues germane to his predicament.  As noted in my records he was canceled by [facility] due to a host of issues including their sterile processing.  Despite my pleading this was not something that they were willing to do and thus he had a delay.  Following this delay he had coronavirus diagnosis which further delayed his issues.  Thus this will be a difficult  reconstruction but one that I do feel is in his [place] interest to try given the power in his hand that he needs for activities.  We discussed all issues plans and concerns.  We are planning surgery for your upper extremity. The risk and benefits of surgery to include risk of bleeding, infection, anesthesia,  damage to normal structures and failure of the surgery to accomplish its intended goals of relieving symptoms and restoring function have been discussed in detail. With this in mind we plan to proceed. I have specifically discussed with the patient the pre-and postoperative regime and the dos and don'ts and risk and benefits in great detail. Risk and benefits of surgery also include risk of dystrophy(CRPS), chronic nerve pain, failure of the healing process to go onto completion and other inherent risks of surgery The relavent the pathophysiology of the disease/injury process, as well as the alternatives for treatment and postoperative course of action has been discussed in great detail with the patient who desires to proceed.  We will do everything in our power to help you (the patient) restore function to the upper extremity. It is a pleasure to see this patient today.   Oletta Cohn III, MD 03/02/2020, 4:41 PM

## 2020-03-02 NOTE — Anesthesia Postprocedure Evaluation (Signed)
Anesthesia Post Note  Patient: Rick Flores  Procedure(s) Performed: Right triceps repair with ulnar nerve release and bursectomy and reconstruction is necessary (Right )     Patient location during evaluation: PACU Anesthesia Type: General Level of consciousness: awake and alert Pain management: pain level controlled Vital Signs Assessment: post-procedure vital signs reviewed and stable Respiratory status: spontaneous breathing, nonlabored ventilation, respiratory function stable and patient connected to nasal cannula oxygen Cardiovascular status: blood pressure returned to baseline and stable Postop Assessment: no apparent nausea or vomiting Anesthetic complications: no   No complications documented.  Last Vitals:  Vitals:   03/02/20 1920 03/02/20 1935  BP: 135/85 131/80  Pulse: 70 73  Resp: 18 19  Temp:  (!) 36.3 C  SpO2: 93% 93%    Last Pain:  Vitals:   03/02/20 1935  TempSrc:   PainSc: 2                  Nelle Don Frieda Arnall

## 2020-03-02 NOTE — Transfer of Care (Signed)
Immediate Anesthesia Transfer of Care Note  Patient:  Rick Flores  Procedure(s) Performed: Right triceps repair with ulnar nerve release and bursectomy and reconstruction is necessary (Right )  Patient Location: PACU  Anesthesia Type:General  Level of Consciousness: awake, alert  and patient cooperative  Airway & Oxygen Therapy: Patient Spontanous Breathing and Patient connected to face mask oxygen  Post-op Assessment: Report given to RN and Post -op Vital signs reviewed and stable  Post vital signs: Reviewed and stable  Last Vitals:  Vitals Value Taken Time  BP 134/79 03/02/20 1848  Temp    Pulse 72 03/02/20 1848  Resp 19 03/02/20 1848  SpO2 100 % 03/02/20 1848  Vitals shown include unvalidated device data.  Last Pain:  Vitals:   03/02/20 1533  TempSrc:   PainSc: 0-No pain      Patients Stated Pain Goal: 3 (03/02/20 1533)  Complications: No complications documented.

## 2020-03-02 NOTE — Anesthesia Procedure Notes (Signed)
Procedure Name: LMA Insertion Date/Time: 03/02/2020 5:01 PM Performed by: Carlos American, CRNA Pre-anesthesia Checklist: Patient identified, Emergency Drugs available, Suction available and Patient being monitored Patient Re-evaluated:Patient Re-evaluated prior to induction Oxygen Delivery Method: Circle System Utilized Preoxygenation: Pre-oxygenation with 100% oxygen Induction Type: IV induction Ventilation: Mask ventilation without difficulty LMA: LMA inserted LMA Size: 5.0 Number of attempts: 1 Airway Equipment and Method: Bite block Placement Confirmation: positive ETCO2 Tube secured with: Tape Dental Injury: Teeth and Oropharynx as per pre-operative assessment

## 2020-03-02 NOTE — Anesthesia Preprocedure Evaluation (Addendum)
Anesthesia Evaluation  Patient identified by MRN, date of birth, ID band Patient awake    Reviewed: Allergy & Precautions, NPO status , Patient's Chart, lab work & pertinent test results  Airway Mallampati: II  TM Distance: >3 FB Neck ROM: Full    Dental no notable dental hx.    Pulmonary former smoker,    Pulmonary exam normal breath sounds clear to auscultation       Cardiovascular negative cardio ROS Normal cardiovascular exam Rhythm:Regular Rate:Normal     Neuro/Psych PSYCHIATRIC DISORDERS Depression negative neurological ROS     GI/Hepatic negative GI ROS, Neg liver ROS,   Endo/Other  negative endocrine ROS  Renal/GU negative Renal ROS     Musculoskeletal  (+) Arthritis ,   Abdominal   Peds  Hematology negative hematology ROS (+)   Anesthesia Other Findings Right triceps rupture  Reproductive/Obstetrics                            Anesthesia Physical Anesthesia Plan  ASA: II  Anesthesia Plan: General   Post-op Pain Management:    Induction: Intravenous  PONV Risk Score and Plan: 2 and Ondansetron, Dexamethasone, Midazolam and Treatment may vary due to age or medical condition  Airway Management Planned: LMA  Additional Equipment:   Intra-op Plan:   Post-operative Plan: Extubation in OR  Informed Consent: I have reviewed the patients History and Physical, chart, labs and discussed the procedure including the risks, benefits and alternatives for the proposed anesthesia with the patient or authorized representative who has indicated his/her understanding and acceptance.     Dental advisory given  Plan Discussed with: CRNA  Anesthesia Plan Comments: (Post-op block discussed)      Anesthesia Quick Evaluation

## 2020-03-02 NOTE — Op Note (Signed)
Operative note 03/02/2020  Rick Severin MD.  Preoperative diagnosis right triceps rupture delayed in presentation with bursitis and pain  Postop diagnosis the same  Operative procedure #1 triceps tendon tenolysis tendon synovectomy and direct repair with bony drill hole technique and Arthrex fiber loop and push lock secure fixation #2 ulnar nerve release at the elbow in situ #3 extensive bursectomy right elbow  Rick Flores  Tourniquet time less than an hour  Anesthesia General LMA  Description of procedure this patient is a 65 year old male with a triceps rupture.  He had a delay in his presentation.  He was scheduled for surgery and [facility] canceled his case due to sterile processing issues.  Despite attempts at trying to get the patient on the schedule this was not an option according to our administration.  Following this the patient came down with coronavirus.  He now presents late with a very difficult to manage triceps rupture.  He understands risk and benefits and desires to proceed.  Operative procedure included counseling followed by a general anesthetic.  He was well-padded prepped and draped with Hibiclens scrub followed by Betadine scrub and paint and in a sloppy lateral position underwent sterile tourniquet application.  Sterile field had been secured and timeout observed.  I inflated the tourniquet made a utilitarian posterior incision.  Skin flaps were developed.  Following this an extensive bursectomy was accomplished with knife blade and scissor.  Patient had a large thick bursa which was removed in its entirety.  This was a bursectomy and was a distinct and separate portion of the procedure.  Following this there is a large amount of scar tissue as expected and I dissected in the posterior medial region to identify the ulnar nerve and performed a ulnar nerve release.  The arcade of Struthers, medial intermuscular septum, cubital tunnel, 2 heads of the FCU and Osborne's  ligament were all released without difficulty.  This was an in situ ulnar nerve release.  Following this we then identified the triceps performed a tenolysis tendon synovectomy and placed suture tape with 4 exiting strands to my satisfaction.  I was quite pleased with this nicely made a video for the patient.  Following this I made bony drill holes and then placed the 4 exiting suture tapes and the fiber loop and through 2 separate tunnels.  I retrieved the 3 sutures and then crisscrossed the suture tape and advance the triceps repair into the bony drill hole tunnel.  The arm was then held in extension and checked and following this we made a drill hole for a push lock anchor.  The 4 strands were placed tension applied and the push lock placed without difficulty.  Excellent tension was noted and there were no complications.  The ulnar nerve sat nicely in a noncompressed position and all looked quite well.  We irrigated copiously deflated the tourniquet obtained hemostasis and closed wound with Prolene.  Standard bulky dressing with posterior and anterior slabs of fiberglass were placed.  I discussed all issues with his wife.  We will plan for elevation movement of the fingers RTC in 2 weeks and Keflex oxycodone Robaxin and Zofran to be used as directed.  All questions have been addressed.  It was a pleasure to see him today.  Sorah Falkenstein MD

## 2020-03-03 ENCOUNTER — Encounter (HOSPITAL_COMMUNITY): Payer: Self-pay | Admitting: Orthopedic Surgery

## 2020-04-19 IMAGING — RF DG HIP (WITH PELVIS) OPERATIVE*R*
1 series · 2 of 2 positions shown · non-contrast
Comparison: Pelvis 07/04/2016.

CLINICAL DATA: 61-year-old male undergoing right hip arthroplasty.

EXAM:
OPERATIVE RIGHT HIP (WITH PELVIS IF PERFORMED) 2 VIEWS
TECHNIQUE: Fluoroscopic spot image(s) were submitted for interpretation
post-operatively.

[Series 1: unknown protocol · 0.20mm/px · 2 of 2 slices shown]
[im 1/2]
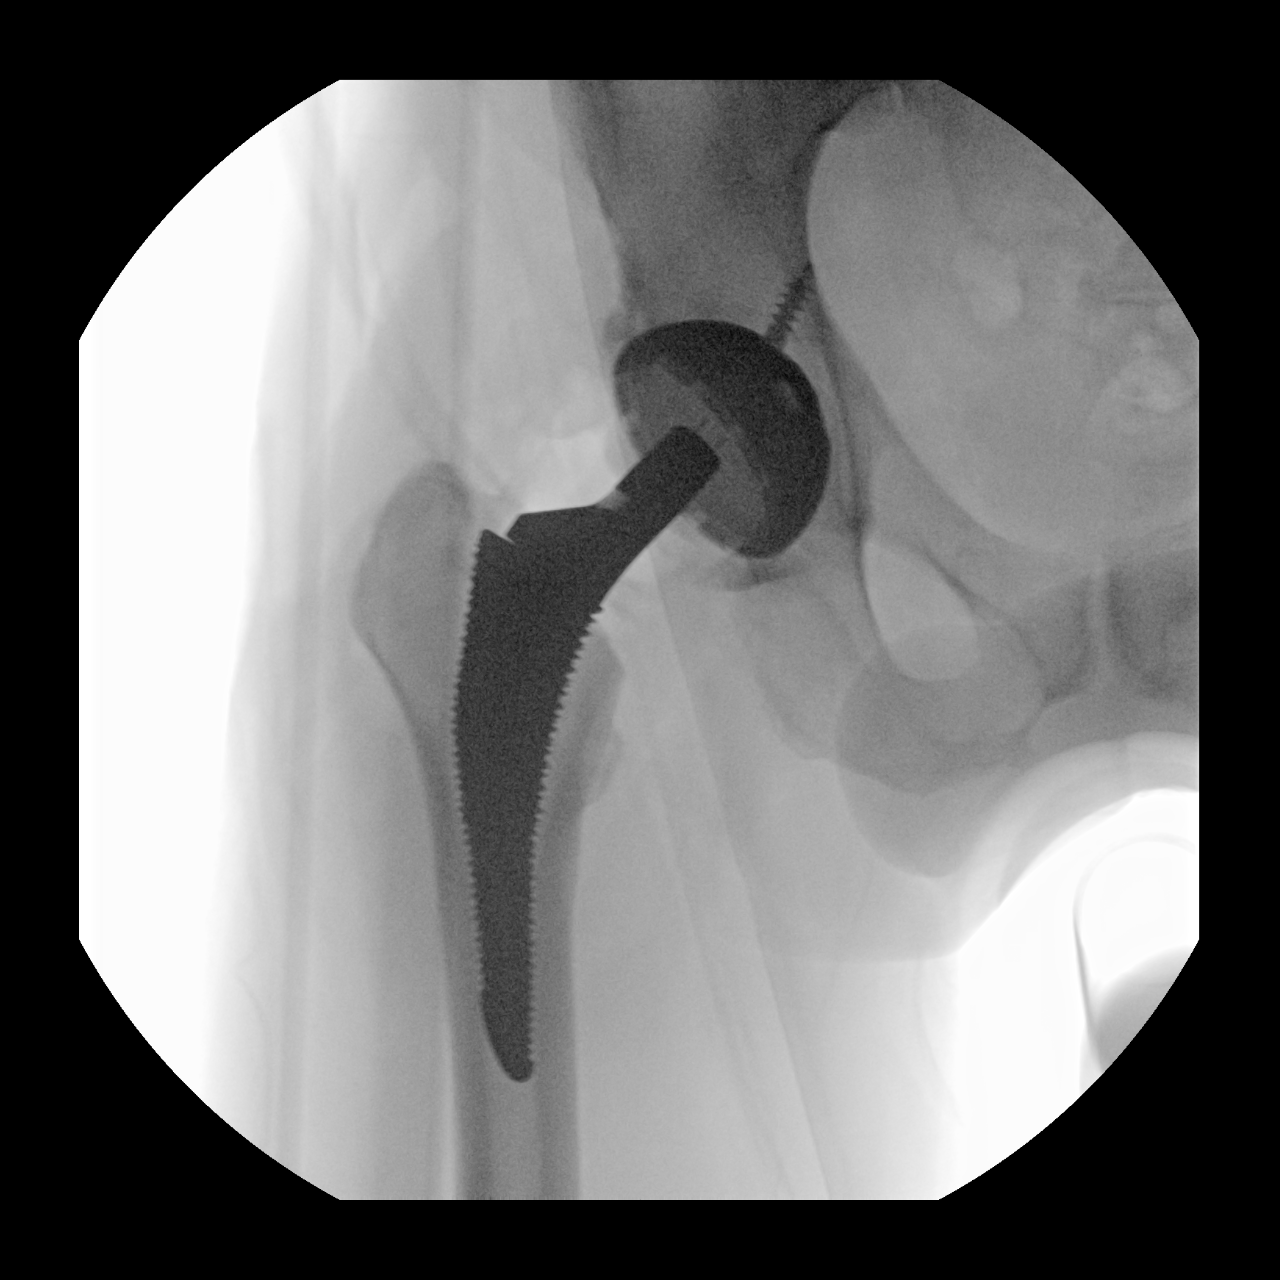
[im 2/2]
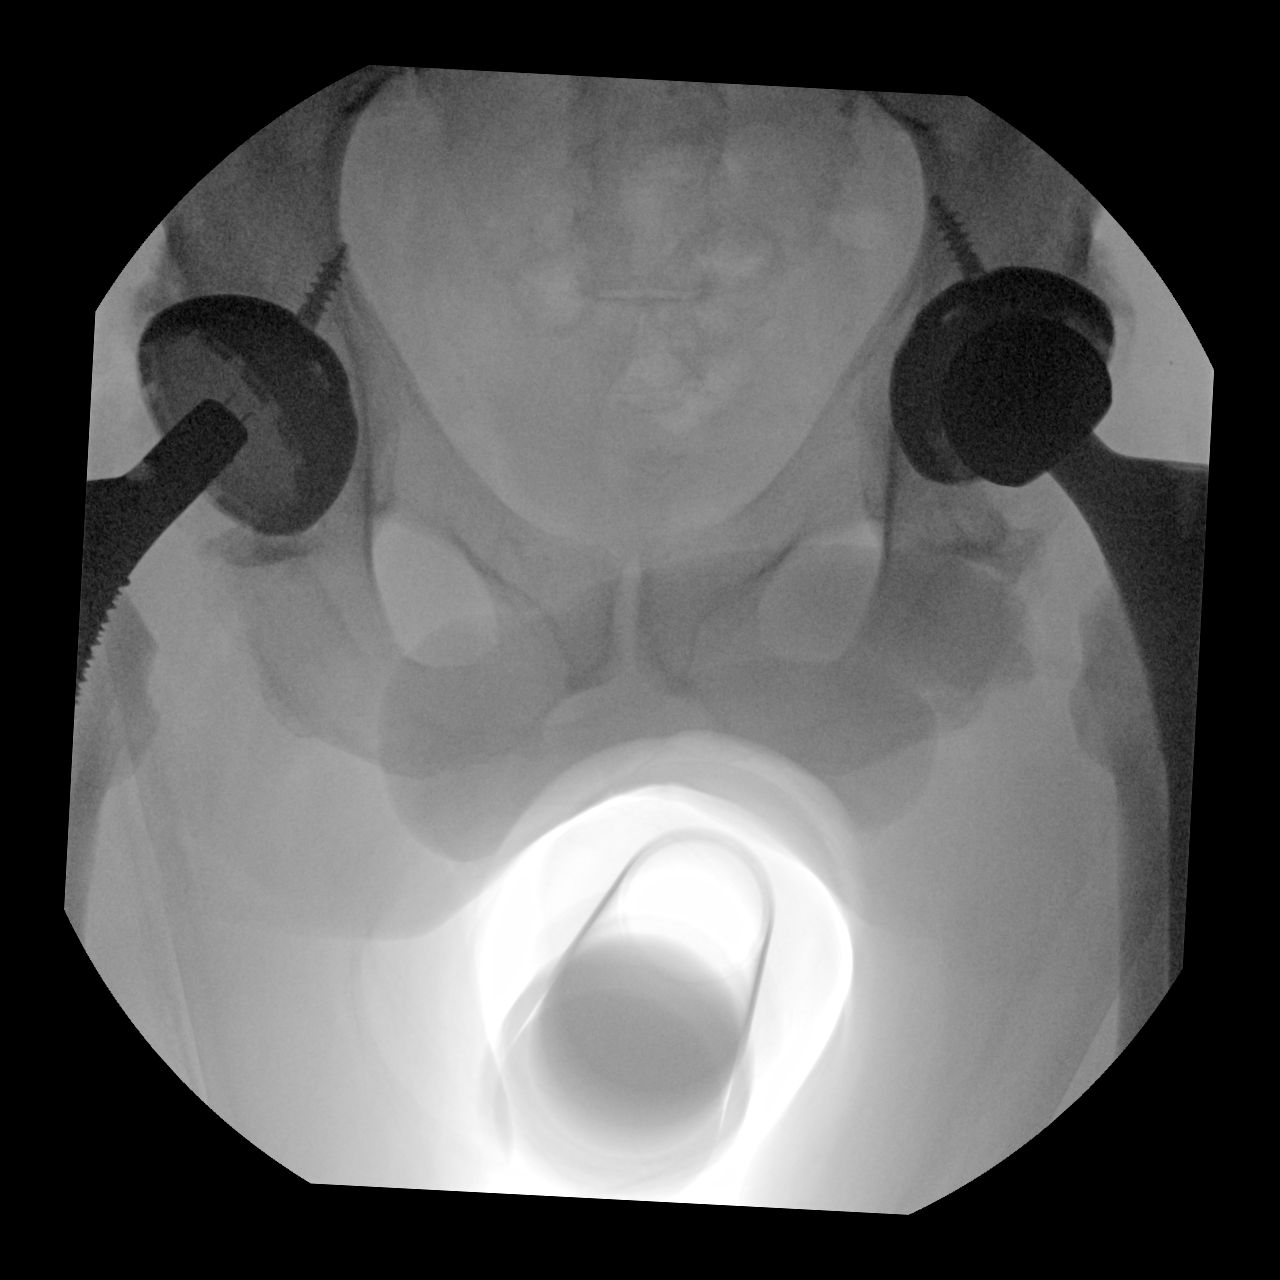

[2 of 2 positions shown; findings below may reference images not displayed]

FINDINGS: 2 intraoperative fluoroscopic spot views of the AP pelvis and right
hip. Right total hip arthroplasty hardware this being placed and
appears normally aligned in the AP view. Preexisting left total hip
arthroplasty.
IMPRESSION: Right total hip arthroplasty underway.

## 2022-10-24 ENCOUNTER — Other Ambulatory Visit: Payer: Self-pay | Admitting: Family Medicine

## 2022-10-24 DIAGNOSIS — Z136 Encounter for screening for cardiovascular disorders: Secondary | ICD-10-CM

## 2022-12-27 ENCOUNTER — Ambulatory Visit
Admission: RE | Admit: 2022-12-27 | Discharge: 2022-12-27 | Disposition: A | Discharge status: Home / Self Care | Payer: 59 | Source: Ambulatory Visit | Attending: Family Medicine | Admitting: Family Medicine

## 2022-12-27 DIAGNOSIS — Z136 Encounter for screening for cardiovascular disorders: Secondary | ICD-10-CM
